# Patient Record
Sex: Male | Born: 1935 | Race: White | Hispanic: No | Marital: Single
Health system: Southern US, Community
[De-identification: ages and names within clinical notes are randomized; demographics above are authoritative.]

## PROBLEM LIST (undated history)

## (undated) DIAGNOSIS — I10 Essential (primary) hypertension: Secondary | ICD-10-CM

## (undated) DIAGNOSIS — E785 Hyperlipidemia, unspecified: Secondary | ICD-10-CM

---

## 2010-07-10 ENCOUNTER — Ambulatory Visit (HOSPITAL_COMMUNITY)
Admission: RE | Admit: 2010-07-10 | Discharge: 2010-07-11 | Payer: Self-pay | Source: Home / Self Care | Admitting: Ophthalmology

## 2011-02-22 LAB — CBC
Hemoglobin: 12.1 g/dL — ABNORMAL LOW (ref 13.0–17.0)
MCHC: 32.4 g/dL (ref 30.0–36.0)
MCV: 94.9 fL (ref 78.0–100.0)
Platelets: 198 10*3/uL (ref 150–400)
WBC: 5.1 10*3/uL (ref 4.0–10.5)

## 2011-02-22 LAB — BASIC METABOLIC PANEL
CO2: 23 mEq/L (ref 19–32)
Calcium: 9.2 mg/dL (ref 8.4–10.5)
Creatinine, Ser: 1.59 mg/dL — ABNORMAL HIGH (ref 0.4–1.5)
GFR calc non Af Amer: 43 mL/min — ABNORMAL LOW (ref >60)
Glucose, Bld: 102 mg/dL — ABNORMAL HIGH (ref 70–99)

## 2011-02-22 LAB — SURGICAL PCR SCREEN: MRSA, PCR: NEGATIVE

## 2017-11-10 ENCOUNTER — Other Ambulatory Visit: Payer: Self-pay | Admitting: Physician Assistant

## 2017-11-10 ENCOUNTER — Ambulatory Visit
Admission: RE | Admit: 2017-11-10 | Discharge: 2017-11-10 | Disposition: A | Discharge status: Home / Self Care | Payer: Medicare Other | Source: Ambulatory Visit | Attending: Physician Assistant | Admitting: Physician Assistant

## 2017-11-10 DIAGNOSIS — M79675 Pain in left toe(s): Secondary | ICD-10-CM

## 2019-12-12 ENCOUNTER — Emergency Department (HOSPITAL_COMMUNITY): Payer: Medicare Other

## 2019-12-12 ENCOUNTER — Other Ambulatory Visit: Payer: Self-pay

## 2019-12-12 ENCOUNTER — Encounter (HOSPITAL_COMMUNITY): Payer: Self-pay | Admitting: Primary Care

## 2019-12-12 ENCOUNTER — Inpatient Hospital Stay (HOSPITAL_COMMUNITY)
Admission: EM | Admit: 2019-12-12 | Discharge: 2019-12-17 | DRG: 065 | Disposition: A | Discharge status: Hospice | Payer: Medicare Other | Attending: Internal Medicine | Admitting: Internal Medicine

## 2019-12-12 DIAGNOSIS — D631 Anemia in chronic kidney disease: Secondary | ICD-10-CM | POA: Diagnosis present

## 2019-12-12 DIAGNOSIS — Z66 Do not resuscitate: Secondary | ICD-10-CM | POA: Diagnosis present

## 2019-12-12 DIAGNOSIS — E785 Hyperlipidemia, unspecified: Secondary | ICD-10-CM | POA: Diagnosis present

## 2019-12-12 DIAGNOSIS — G9349 Other encephalopathy: Secondary | ICD-10-CM | POA: Diagnosis present

## 2019-12-12 DIAGNOSIS — N1831 Chronic kidney disease, stage 3a: Secondary | ICD-10-CM | POA: Diagnosis present

## 2019-12-12 DIAGNOSIS — R414 Neurologic neglect syndrome: Secondary | ICD-10-CM | POA: Diagnosis present

## 2019-12-12 DIAGNOSIS — Z79899 Other long term (current) drug therapy: Secondary | ICD-10-CM | POA: Diagnosis not present

## 2019-12-12 DIAGNOSIS — G8191 Hemiplegia, unspecified affecting right dominant side: Secondary | ICD-10-CM | POA: Diagnosis present

## 2019-12-12 DIAGNOSIS — Z7189 Other specified counseling: Secondary | ICD-10-CM

## 2019-12-12 DIAGNOSIS — I10 Essential (primary) hypertension: Secondary | ICD-10-CM | POA: Diagnosis not present

## 2019-12-12 DIAGNOSIS — R319 Hematuria, unspecified: Secondary | ICD-10-CM | POA: Diagnosis present

## 2019-12-12 DIAGNOSIS — H53462 Homonymous bilateral field defects, left side: Secondary | ICD-10-CM | POA: Diagnosis present

## 2019-12-12 DIAGNOSIS — Z515 Encounter for palliative care: Secondary | ICD-10-CM

## 2019-12-12 DIAGNOSIS — R627 Adult failure to thrive: Secondary | ICD-10-CM | POA: Diagnosis present

## 2019-12-12 DIAGNOSIS — I4510 Unspecified right bundle-branch block: Secondary | ICD-10-CM | POA: Diagnosis present

## 2019-12-12 DIAGNOSIS — I63412 Cerebral infarction due to embolism of left middle cerebral artery: Secondary | ICD-10-CM | POA: Diagnosis present

## 2019-12-12 DIAGNOSIS — R29810 Facial weakness: Secondary | ICD-10-CM | POA: Diagnosis present

## 2019-12-12 DIAGNOSIS — Z8673 Personal history of transient ischemic attack (TIA), and cerebral infarction without residual deficits: Secondary | ICD-10-CM | POA: Diagnosis not present

## 2019-12-12 DIAGNOSIS — W19XXXA Unspecified fall, initial encounter: Secondary | ICD-10-CM

## 2019-12-12 DIAGNOSIS — R1310 Dysphagia, unspecified: Secondary | ICD-10-CM | POA: Diagnosis present

## 2019-12-12 DIAGNOSIS — I6389 Other cerebral infarction: Secondary | ICD-10-CM | POA: Diagnosis not present

## 2019-12-12 DIAGNOSIS — I739 Peripheral vascular disease, unspecified: Secondary | ICD-10-CM | POA: Diagnosis present

## 2019-12-12 DIAGNOSIS — K429 Umbilical hernia without obstruction or gangrene: Secondary | ICD-10-CM | POA: Diagnosis present

## 2019-12-12 DIAGNOSIS — R29724 NIHSS score 24: Secondary | ICD-10-CM | POA: Diagnosis present

## 2019-12-12 DIAGNOSIS — R339 Retention of urine, unspecified: Secondary | ICD-10-CM | POA: Diagnosis present

## 2019-12-12 DIAGNOSIS — I63512 Cerebral infarction due to unspecified occlusion or stenosis of left middle cerebral artery: Secondary | ICD-10-CM | POA: Diagnosis not present

## 2019-12-12 DIAGNOSIS — Z20822 Contact with and (suspected) exposure to covid-19: Secondary | ICD-10-CM | POA: Diagnosis present

## 2019-12-12 DIAGNOSIS — I639 Cerebral infarction, unspecified: Secondary | ICD-10-CM | POA: Diagnosis not present

## 2019-12-12 DIAGNOSIS — I129 Hypertensive chronic kidney disease with stage 1 through stage 4 chronic kidney disease, or unspecified chronic kidney disease: Secondary | ICD-10-CM | POA: Diagnosis present

## 2019-12-12 DIAGNOSIS — A630 Anogenital (venereal) warts: Secondary | ICD-10-CM | POA: Diagnosis present

## 2019-12-12 DIAGNOSIS — H5461 Unqualified visual loss, right eye, normal vision left eye: Secondary | ICD-10-CM | POA: Diagnosis present

## 2019-12-12 DIAGNOSIS — R4701 Aphasia: Secondary | ICD-10-CM | POA: Diagnosis present

## 2019-12-12 HISTORY — DX: Hyperlipidemia, unspecified: E78.5

## 2019-12-12 HISTORY — DX: Essential (primary) hypertension: I10

## 2019-12-12 LAB — APTT: aPTT: 24 seconds (ref 24–36)

## 2019-12-12 LAB — CBC
HCT: 36.8 % — ABNORMAL LOW (ref 39.0–52.0)
Hemoglobin: 11.7 g/dL — ABNORMAL LOW (ref 13.0–17.0)
MCH: 28.9 pg (ref 26.0–34.0)
MCHC: 31.8 g/dL (ref 30.0–36.0)
MCV: 90.9 fL (ref 80.0–100.0)
Platelets: 193 10*3/uL (ref 150–400)
RBC: 4.05 MIL/uL — ABNORMAL LOW (ref 4.22–5.81)
RDW: 13.3 % (ref 11.5–15.5)
WBC: 11.6 10*3/uL — ABNORMAL HIGH (ref 4.0–10.5)
nRBC: 0 % (ref 0.0–0.2)

## 2019-12-12 LAB — COMPREHENSIVE METABOLIC PANEL
ALT: 13 U/L (ref 0–44)
AST: 21 U/L (ref 15–41)
Albumin: 3.3 g/dL — ABNORMAL LOW (ref 3.5–5.0)
Alkaline Phosphatase: 151 U/L — ABNORMAL HIGH (ref 38–126)
Anion gap: 11 (ref 5–15)
BUN: 22 mg/dL (ref 8–23)
CO2: 21 mmol/L — ABNORMAL LOW (ref 22–32)
Calcium: 9.2 mg/dL (ref 8.9–10.3)
Chloride: 105 mmol/L (ref 98–111)
Creatinine, Ser: 1.56 mg/dL — ABNORMAL HIGH (ref 0.61–1.24)
GFR calc Af Amer: 48 mL/min — ABNORMAL LOW (ref >60)
GFR calc non Af Amer: 41 mL/min — ABNORMAL LOW (ref >60)
Glucose, Bld: 189 mg/dL — ABNORMAL HIGH (ref 70–99)
Potassium: 4.3 mmol/L (ref 3.5–5.1)
Sodium: 137 mmol/L (ref 135–145)
Total Bilirubin: 0.9 mg/dL (ref 0.3–1.2)
Total Protein: 6.6 g/dL (ref 6.5–8.1)

## 2019-12-12 LAB — I-STAT CHEM 8, ED
BUN: 22 mg/dL (ref 8–23)
Calcium, Ion: 1.18 mmol/L (ref 1.15–1.40)
Chloride: 106 mmol/L (ref 98–111)
Creatinine, Ser: 1.5 mg/dL — ABNORMAL HIGH (ref 0.61–1.24)
Glucose, Bld: 181 mg/dL — ABNORMAL HIGH (ref 70–99)
HCT: 36 % — ABNORMAL LOW (ref 39.0–52.0)
Hemoglobin: 12.2 g/dL — ABNORMAL LOW (ref 13.0–17.0)
Potassium: 4.2 mmol/L (ref 3.5–5.1)
Sodium: 138 mmol/L (ref 135–145)
TCO2: 23 mmol/L (ref 22–32)

## 2019-12-12 LAB — RESPIRATORY PANEL BY RT PCR (FLU A&B, COVID)
Influenza A by PCR: NEGATIVE
Influenza B by PCR: NEGATIVE
SARS Coronavirus 2 by RT PCR: NEGATIVE

## 2019-12-12 LAB — DIFFERENTIAL
Abs Immature Granulocytes: 0.04 10*3/uL (ref 0.00–0.07)
Basophils Absolute: 0 10*3/uL (ref 0.0–0.1)
Basophils Relative: 0 %
Eosinophils Absolute: 0 10*3/uL (ref 0.0–0.5)
Eosinophils Relative: 0 %
Immature Granulocytes: 0 %
Lymphocytes Relative: 6 %
Lymphs Abs: 0.6 10*3/uL — ABNORMAL LOW (ref 0.7–4.0)
Monocytes Absolute: 0.6 10*3/uL (ref 0.1–1.0)
Monocytes Relative: 5 %
Neutro Abs: 10.3 10*3/uL — ABNORMAL HIGH (ref 1.7–7.7)
Neutrophils Relative %: 89 %

## 2019-12-12 LAB — CBG MONITORING, ED: Glucose-Capillary: 165 mg/dL — ABNORMAL HIGH (ref 70–99)

## 2019-12-12 LAB — CK: Total CK: 46 U/L — ABNORMAL LOW (ref 49–397)

## 2019-12-12 LAB — PROTIME-INR
INR: 1.3 — ABNORMAL HIGH (ref 0.8–1.2)
Prothrombin Time: 16.1 seconds — ABNORMAL HIGH (ref 11.4–15.2)

## 2019-12-12 MED ORDER — STROKE: EARLY STAGES OF RECOVERY BOOK: Freq: Once | Status: DC

## 2019-12-12 MED ORDER — LATANOPROST 0.005 % OP SOLN
1.0000 [drp] | Freq: Every day | OPHTHALMIC | Status: DC
  Administered 2019-12-12 – 2019-12-16 (×5): 1 [drp] via OPHTHALMIC
  Filled 2019-12-12: qty 2.5

## 2019-12-12 MED ORDER — SODIUM CHLORIDE 0.9 % IV SOLN: INTRAVENOUS | Status: DC

## 2019-12-12 MED ORDER — ASPIRIN 300 MG RE SUPP
300.0000 mg | Freq: Every day | RECTAL | Status: DC
  Administered 2019-12-12 – 2019-12-16 (×5): 300 mg via RECTAL
  Filled 2019-12-12 (×5): qty 1

## 2019-12-12 MED ORDER — ACETAMINOPHEN 160 MG/5ML PO SOLN: 650.0000 mg | ORAL | Status: DC | PRN

## 2019-12-12 MED ORDER — ENOXAPARIN SODIUM 40 MG/0.4ML ~~LOC~~ SOLN: 40.0000 mg | SUBCUTANEOUS | Status: DC

## 2019-12-12 MED ORDER — SODIUM CHLORIDE 0.9% FLUSH: 3.0000 mL | Freq: Once | INTRAVENOUS | Status: DC

## 2019-12-12 MED ORDER — ASPIRIN 325 MG PO TABS: 325.0000 mg | ORAL_TABLET | Freq: Every day | ORAL | Status: DC

## 2019-12-12 MED ORDER — ACETAMINOPHEN 325 MG PO TABS: 650.0000 mg | ORAL_TABLET | ORAL | Status: DC | PRN

## 2019-12-12 MED ORDER — ACETAMINOPHEN 650 MG RE SUPP
650.0000 mg | RECTAL | Status: DC | PRN
  Administered 2019-12-13 – 2019-12-15 (×2): 650 mg via RECTAL
  Filled 2019-12-12 (×2): qty 1

## 2019-12-12 MED ORDER — IOHEXOL 350 MG/ML SOLN
100.0000 mL | Freq: Once | INTRAVENOUS | Status: AC | PRN
  Administered 2019-12-12: 100 mL via INTRAVENOUS

## 2019-12-12 NOTE — Consult Note (Addendum)
NEURO HOSPITALIST  CONSULT   Requesting Physician: Dr. Lockie Mola    Chief Complaint: Code stroke-left gaze and right side paralysis  History obtained from: Friend/ EMS HPI:                                                                                                                                         Bernard Schmidt is an 84 y.o. male with PMH HTN, HLD who presented to [facility] ED as a code stroke for right side paralysis and left gaze.  Patient unable to provide any history.  Spoke to [employer] ( friend, POA ). He rents a room from [place]. Per her report: He has HTN and HLD. He is able to perform most ADL's independently, but does not cook. Does not drive, mainly due to visual issues. Not on any blood thinners. LKW was 1400 12/11/19. Friend came to pick him up for church and found him lying in the floor.   ED course:  167/72 BG: 165 CTH: no hemorrhage CTA: proximal M1 occlussion CTP:  Core: 45ml, penumbra 168 cc Stat MRI being done  Date last known well: 12/11/19 Time last known well: 1400 tPA Given: no, outside of window Modified Rankin: Rankin Score=1 NIHSS:22  Past Medical History:  Diagnosis Date  . HLD (hyperlipidemia)   . HTN (hypertension)    Family history: Could not be obtained due to patient's aphasia No family history on file.       Social History:  has no history on file for tobacco, alcohol, and drug. Cannot be obtained due to patient's aphasia Allergies: No Known Allergies  Medications:                                                                                                                           Current Facility-Administered Medications  Medication Dose Route Frequency Provider Last Rate Last Admin  . sodium chloride flush (NS) 0.9 % injection 3 mL  3 mL Intravenous Once Virgina Norfolk, DO       Current Outpatient Medications  Medication Sig Dispense Refill  . atorvastatin (LIPITOR) 20 MG  tablet  Take 20 mg by mouth daily.    . benazepril (LOTENSIN) 20 MG tablet Take 20 mg by mouth daily.    Marland Kitchen latanoprost (XALATAN) 0.005 % ophthalmic solution Place 1 drop into the left eye at bedtime.       ROS:                                                                                                                                        unobtainable from patient due to mental status   General Examination:                                                                                                      Blood pressure (!) 121/57, pulse (!) 114, temperature 97.9 F (36.6 C), temperature source Oral, resp. rate (!) 24, SpO2 90 %.  Physical Exam  Constitutional: Appears well-developed and well-nourished.  Psych: Affect appropriate to situation Eyes: Normal external eye and conjunctiva. HENT: Normocephalic, no lesions, without obvious abnormality.   Musculoskeletal-no joint tenderness, deformity or swelling Cardiovascular: Normal rate and regular rhythm.  Respiratory: Effort normal, non-labored breathing saturations WNL GI: Soft.  No distension. There is no tenderness.  Skin: WDI  Neurological Examination Mental Status: Alert, not oriented. Aphasia noted. Dysarthria noted. Not following commands. Left gaze preference. Cranial Nerves: hemianopsia, ptosis not present, left gaze preference. Right  Eye opaque. Left pupil reactive.  smile asymmetric, facial droop right side.   hearing normal bilaterally  Motor/sensory: Right upper extremity responds to noxious stimuli with extension.  Right lower extremities attempts to withdraw. LUE and LLE withdrawal noted.  Tone and bulk:normal tone throughout; no atrophy noted Cerebellar: UTA Gait: deferred   Lab Results: Basic Metabolic Panel: Recent Labs  Lab 12/12/19 1145 12/12/19 1154  NA 137 138  K 4.3 4.2  CL 105 106  CO2 21*  --   GLUCOSE 189* 181*  BUN 22 22  CREATININE 1.56* 1.50*  CALCIUM 9.2  --     CBC: Recent Labs   Lab 12/12/19 1145 12/12/19 1154  WBC 11.6*  --   NEUTROABS 10.3*  --   HGB 11.7* 12.2*  HCT 36.8* 36.0*  MCV 90.9  --   PLT 193  --     CBG: Recent Labs  Lab 12/12/19 1140  GLUCAP 165*    Imaging: CT Code Stroke CTA Head W/WO contrast  Result Date: 12/12/2019 CLINICAL DATA:  Code stroke.  Right-sided weakness. EXAM: CT ANGIOGRAPHY HEAD AND NECK  CT PERFUSION BRAIN TECHNIQUE: Multidetector CT imaging of the head and neck was performed using the standard protocol during bolus administration of intravenous contrast. Multiplanar CT image reconstructions and MIPs were obtained to evaluate the vascular anatomy. Carotid stenosis measurements (when applicable) are obtained utilizing NASCET criteria, using the distal internal carotid diameter as the denominator. Multiphase CT imaging of the brain was performed following IV bolus contrast injection. Subsequent parametric perfusion maps were calculated using RAPID software. CONTRAST:  Administered intravenous contrast not known at this time. COMPARISON:  Noncontrast head CT performed concurrently. FINDINGS: CTA NECK FINDINGS Aortic arch: Standard aortic branching. Soft and calcified plaque within the visualized aortic arch and proximal major branch vessels of the neck. Right carotid system: CCA and ICA patent within the neck without significant stenosis. Minimal calcified plaque at the carotid bifurcation. Partially retropharyngeal course of the ICA. Left carotid system: CCA and ICA patent within the neck without significant stenosis. Minimal soft plaque within the CCA. Minimal mixed plaque at the carotid bifurcation. Vertebral arteries: Codominant. Calcified plaque at the bilateral vertebral artery origins without ostial stenosis. The right vertebral artery is patent throughout the neck with no significant atherosclerotic narrowing. There is moderate narrowing of the right vertebral artery at the C3-C4 level due to prominent uncinate and facet hypertrophy  (series 7, image 202). The left vertebral artery is patent throughout the neck without significant stenosis. Skeleton: No acute bony abnormality. Cervical spondylosis with multilevel posterior disc osteophytes, uncovertebral and facet hypertrophy. There are also prominent multilevel ventral osteophytes. Other neck: No neck mass or cervical lymphadenopathy. Upper chest: No consolidation within the imaged lung apices. Review of the MIP images confirms the above findings CTA HEAD FINDINGS Anterior circulation: The bilateral intracranial internal carotid arteries are patent with scattered calcified plaque but no significant stenosis. The right middle and anterior cerebral arteries are patent without proximal branch occlusion. 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 The left anterior cerebral artery is patent without proximal branch occlusion. Posterior circulation: The intracranial vertebral arteries are patent bilaterally. Mild atherosclerotic narrowing of the intracranial left vertebral artery. The basilar artery is patent without significant stenosis. Bilateral posterior cerebral arteries are patent without proximal branch occlusion. High-grade stenosis within the P3 segment on the right Venous sinuses: Within limitations of contrast timing, no convincing thrombus. Anatomic variants: Posterior communicating arteries are poorly delineated and may be hypoplastic or absent bilaterally. Review of the MIP images confirms the above findings CT Brain Perfusion Findings: ASPECTS: 10 CBF (<30%) Volume: 73mL Perfusion (Tmax>6.0s) volume: Mismatch Volume: 80 mL Infarction Location:Left MCA vascular territory. These results were called by telephone at the time of interpretation on 12/12/2019 at 12:15 pm to provider Dr. Wilford Corner, who verbally acknowledged these results. IMPRESSION: CTA neck: 1. The bilateral common and internal carotid arteries  are patent without significant stenosis. Mild atherosclerotic plaque within the bilateral carotid systems, as described. 2. Moderate narrowing of the right vertebral artery at the C3-C4 level due to uncinate/facet hypertrophy. No significant atherosclerotic narrowing of the vertebral arteries within the neck. CTA head: 1. 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 2. No other proximal large vessel occlusion identified. 3. High-grade focal stenosis within the P3 right PCA of uncertain chronicity. CT perfusion head: 1. Significant motion degradation may limit reliability of the perfusion data. 2. The perfusion software identifies an 88 mL core infarct in the left MCA territory. The perfusion software identifies  a 168 mL region of critically hypoperfused parenchyma in the left MCA territory. Reported mismatch volume 80 mL. Reported mismatch ratio 1.9. This suggest a much larger core infarct than is identifiable on the noncontrast head CT. Electronically Signed   By: Jackey Loge DO   On: 12/12/2019 12:28   CT Code Stroke CTA Neck W/WO contrast  Result Date: 12/12/2019 CLINICAL DATA:  Code stroke.  Right-sided weakness. EXAM: CT ANGIOGRAPHY HEAD AND NECK CT PERFUSION BRAIN TECHNIQUE: Multidetector CT imaging of the head and neck was performed using the standard protocol during bolus administration of intravenous contrast. Multiplanar CT image reconstructions and MIPs were obtained to evaluate the vascular anatomy. Carotid stenosis measurements (when applicable) are obtained utilizing NASCET criteria, using the distal internal carotid diameter as the denominator. Multiphase CT imaging of the brain was performed following IV bolus contrast injection. Subsequent parametric perfusion maps were calculated using RAPID software. CONTRAST:  Administered intravenous contrast not known at this time. COMPARISON:  Noncontrast head CT performed concurrently.  FINDINGS: CTA NECK FINDINGS Aortic arch: Standard aortic branching. Soft and calcified plaque within the visualized aortic arch and proximal major branch vessels of the neck. Right carotid system: CCA and ICA patent within the neck without significant stenosis. Minimal calcified plaque at the carotid bifurcation. Partially retropharyngeal course of the ICA. Left carotid system: CCA and ICA patent within the neck without significant stenosis. Minimal soft plaque within the CCA. Minimal mixed plaque at the carotid bifurcation. Vertebral arteries: Codominant. Calcified plaque at the bilateral vertebral artery origins without ostial stenosis. The right vertebral artery is patent throughout the neck with no significant atherosclerotic narrowing. There is moderate narrowing of the right vertebral artery at the C3-C4 level due to prominent uncinate and facet hypertrophy (series 7, image 202). The left vertebral artery is patent throughout the neck without significant stenosis. Skeleton: No acute bony abnormality. Cervical spondylosis with multilevel posterior disc osteophytes, uncovertebral and facet hypertrophy. There are also prominent multilevel ventral osteophytes. Other neck: No neck mass or cervical lymphadenopathy. Upper chest: No consolidation within the imaged lung apices. Review of the MIP images confirms the above findings CTA HEAD FINDINGS Anterior circulation: The bilateral intracranial internal carotid arteries are patent with scattered calcified plaque but no significant stenosis. The right middle and anterior cerebral arteries are patent without proximal branch occlusion. 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 The left anterior cerebral artery is patent without proximal branch occlusion. Posterior circulation: The intracranial vertebral arteries are patent bilaterally. Mild atherosclerotic narrowing of the intracranial left  vertebral artery. The basilar artery is patent without significant stenosis. Bilateral posterior cerebral arteries are patent without proximal branch occlusion. High-grade stenosis within the P3 segment on the right Venous sinuses: Within limitations of contrast timing, no convincing thrombus. Anatomic variants: Posterior communicating arteries are poorly delineated and may be hypoplastic or absent bilaterally. Review of the MIP images confirms the above findings CT Brain Perfusion Findings: ASPECTS: 10 CBF (<30%) Volume: 88mL Perfusion (Tmax>6.0s) volume: Mismatch Volume: 80 mL Infarction Location:Left MCA vascular territory. These results were called by telephone at the time of interpretation on 12/12/2019 at 12:15 pm to provider Dr. Wilford Corner, who verbally acknowledged these results. IMPRESSION: CTA neck: 1. The bilateral common and internal carotid arteries are patent without significant stenosis. Mild atherosclerotic plaque within the bilateral carotid systems, as described. 2. Moderate narrowing of the right vertebral artery at the C3-C4 level due to uncinate/facet hypertrophy. No significant atherosclerotic  narrowing of the vertebral arteries within the neck. CTA head: 1. 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 2. No other proximal large vessel occlusion identified. 3. High-grade focal stenosis within the P3 right PCA of uncertain chronicity. CT perfusion head: 1. Significant motion degradation may limit reliability of the perfusion data. 2. The perfusion software identifies an 88 mL core infarct in the left MCA territory. The perfusion software identifies a 168 mL region of critically hypoperfused parenchyma in the left MCA territory. Reported mismatch volume 80 mL. Reported mismatch ratio 1.9. This suggest a much larger core infarct than is identifiable on the noncontrast head CT. Electronically Signed   By: Jackey LogeKyle  Golden DO   On: 12/12/2019  12:28   MR BRAIN WO CONTRAST  Result Date: 12/12/2019 CLINICAL DATA:  Neuro deficit, acute, stroke suspected. EXAM: MRI HEAD WITHOUT CONTRAST TECHNIQUE: Multiplanar, multiecho pulse sequences of the brain and surrounding structures were obtained without intravenous contrast. COMPARISON:  Noncontrast head CT and CT angiogram head/neck as well as CT perfusion performed earlier the same day. FINDINGS: Brain: A limited protocol utilizing only axial and coronal diffusion-weighted imaging, as well as axial T2 FLAIR imaging is performed. The axial T2 FLAIR sequence is significantly motion degraded, limiting evaluation. There is a moderate to large region of cortical/subcortical restricted diffusion within the left MCA vascular territory involving the left temporal lobe, left insula and left frontal operculum. To a lesser degree there is acute infarction change within the left parietal cortex. There is patchy, mild corresponding T2/FLAIR hyperintensity. There are multiple additional punctate cortically based infarcts within the high left frontal lobe, right frontal lobe and bilateral parietooccipital lobes. A small acute infarct is also questioned within the right basal ganglia. There are also several small acute infarcts within the cerebellar hemispheres. Chronic right temporal occipital lobe cortically based infarct. Background of moderate chronic small vessel ischemic disease with chronic lacunar infarcts within bilateral basal ganglia and thalami. Vascular: Poorly assessed on the acquired sequences. Skull and upper cervical spine: Limited assessment for calvarial lesions on the acquired sequences. No marrow signal abnormality identified. Sinuses/Orbits: Limited assessment of the orbits on the acquired sequences. Partial opacification of the left maxillary sinus. These results were called by telephone at the time of interpretation on 12/12/2019 at 12:59 pm to provider [facility] Asc[facility] Jet Armbrust , who verbally acknowledged these  results. IMPRESSION: Limited protocol, motion degraded examination. Moderate to large acute infarct within the left MCA vascular territory, as detailed. Multiple small additional acute infarcts within the bilateral frontal and parietooccipital lobes, right basal ganglia and cerebellum. Correlate for an embolic phenomenon. Chronic right temporal occipital cortically based infarct. Background of moderate chronic small vessel ischemic disease. Chronic lacunar infarcts within the bilateral basal ganglia and thalami. Electronically Signed   By: Jackey LogeKyle  Golden DO   On: 12/12/2019 12:58   CT Code Stroke Cerebral Perfusion with contrast  Result Date: 12/12/2019 CLINICAL DATA:  Code stroke.  Right-sided weakness. EXAM: CT ANGIOGRAPHY HEAD AND NECK CT PERFUSION BRAIN TECHNIQUE: Multidetector CT imaging of the head and neck was performed using the standard protocol during bolus administration of intravenous contrast. Multiplanar CT image reconstructions and MIPs were obtained to evaluate the vascular anatomy. Carotid stenosis measurements (when applicable) are obtained utilizing NASCET criteria, using the distal internal carotid diameter as the denominator. Multiphase CT imaging of the brain was performed following IV bolus contrast injection. Subsequent parametric perfusion maps were calculated using RAPID software. CONTRAST:  Administered intravenous contrast not  known at this time. COMPARISON:  Noncontrast head CT performed concurrently. FINDINGS: CTA NECK FINDINGS Aortic arch: Standard aortic branching. Soft and calcified plaque within the visualized aortic arch and proximal major branch vessels of the neck. Right carotid system: CCA and ICA patent within the neck without significant stenosis. Minimal calcified plaque at the carotid bifurcation. Partially retropharyngeal course of the ICA. Left carotid system: CCA and ICA patent within the neck without significant stenosis. Minimal soft plaque within the CCA. Minimal  mixed plaque at the carotid bifurcation. Vertebral arteries: Codominant. Calcified plaque at the bilateral vertebral artery origins without ostial stenosis. The right vertebral artery is patent throughout the neck with no significant atherosclerotic narrowing. There is moderate narrowing of the right vertebral artery at the C3-C4 level due to prominent uncinate and facet hypertrophy (series 7, image 202). The left vertebral artery is patent throughout the neck without significant stenosis. Skeleton: No acute bony abnormality. Cervical spondylosis with multilevel posterior disc osteophytes, uncovertebral and facet hypertrophy. There are also prominent multilevel ventral osteophytes. Other neck: No neck mass or cervical lymphadenopathy. Upper chest: No consolidation within the imaged lung apices. Review of the MIP images confirms the above findings CTA HEAD FINDINGS Anterior circulation: The bilateral intracranial internal carotid arteries are patent with scattered calcified plaque but no significant stenosis. The right middle and anterior cerebral arteries are patent without proximal branch occlusion. 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 The left anterior cerebral artery is patent without proximal branch occlusion. Posterior circulation: The intracranial vertebral arteries are patent bilaterally. Mild atherosclerotic narrowing of the intracranial left vertebral artery. The basilar artery is patent without significant stenosis. Bilateral posterior cerebral arteries are patent without proximal branch occlusion. High-grade stenosis within the P3 segment on the right Venous sinuses: Within limitations of contrast timing, no convincing thrombus. Anatomic variants: Posterior communicating arteries are poorly delineated and may be hypoplastic or absent bilaterally. Review of the MIP images confirms the above findings CT Brain Perfusion  Findings: ASPECTS: 10 CBF (<30%) Volume: 71mL Perfusion (Tmax>6.0s) volume: 165mL Mismatch Volume: 80 mL Infarction Location:Left MCA vascular territory. These results were called by telephone at the time of interpretation on 12/12/2019 at 12:15 pm to provider Dr. Rory Percy, who verbally acknowledged these results. IMPRESSION: CTA neck: 1. The bilateral common and internal carotid arteries are patent without significant stenosis. Mild atherosclerotic plaque within the bilateral carotid systems, as described. 2. Moderate narrowing of the right vertebral artery at the C3-C4 level due to uncinate/facet hypertrophy. No significant atherosclerotic narrowing of the vertebral arteries within the neck. CTA head: 1. 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 2. No other proximal large vessel occlusion identified. 3. High-grade focal stenosis within the P3 right PCA of uncertain chronicity. CT perfusion head: 1. Significant motion degradation may limit reliability of the perfusion data. 2. The perfusion software identifies an 88 mL core infarct in the left MCA territory. The perfusion software identifies a 168 mL region of critically hypoperfused parenchyma in the left MCA territory. Reported mismatch volume 80 mL. Reported mismatch ratio 1.9. This suggest a much larger core infarct than is identifiable on the noncontrast head CT. Electronically Signed   By: Kellie Simmering DO   On: 12/12/2019 12:28   CT HEAD CODE STROKE WO CONTRAST  Result Date: 12/12/2019 CLINICAL DATA:  Code stroke. Neuro deficit, acute, stroke suspected. Sudden onset right-sided weakness, aphasia, left gaze EXAM: CT HEAD WITHOUT  CONTRAST TECHNIQUE: Contiguous axial images were obtained from the base of the skull through the vertex without intravenous contrast. COMPARISON:  No pertinent prior studies available for comparison. FINDINGS: Brain: No evidence of acute intracranial hemorrhage. No acute  demarcated cortical infarct is identified. Chronic appearing right temporal occipital lobe cortically based infarct. Ill-defined hypoattenuation within the cerebral white matter is nonspecific, but consistent with chronic small vessel ischemic disease. Age-indeterminate lacunar infarcts within the right basal ganglia and left thalamus. Mild generalized parenchymal atrophy. No evidence of intracranial mass. No midline shift. Probable thin right frontoparietal hygroma or chronic subdural hematoma measuring up to 5 mm (series 5, image 28). Vascular: A dense M1 left MCA is questioned (series 3, image 17). Atherosclerotic calcifications. Skull: Normal. Negative for fracture or focal lesion. Sinuses/Orbits: Visualized orbits demonstrate no acute abnormality. Partial opacification of the left maxillary sinus. Mucous retention cyst within the inferior right maxillary sinus. No significant mastoid effusion. These results were called by telephone at the time of interpretation on 12/12/2019 at 12:06 pm to provider Dr. Wilford CornerArora, who verbally acknowledged these results. IMPRESSION: No acute intracranial hemorrhage or acute demarcated cortical infarction identified. Suspicion for hyperdense M1 left MCA. ASPECTS 10 for left MCA vascular territory. Age-indeterminate lacunar infarcts within the right basal ganglia and left thalamus. 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 Chronic appearing cortically based infarct within the right temporal occipital lobes. Background generalized parenchymal atrophy and chronic small vessel ischemic disease. Electronically Signed   By: Jackey LogeKyle  Golden DO   On: 12/12/2019 12:09   Valentina LucksJessica Williams, MSN, NP-C [facility] [phone]  12/12/2019, 1:36 PM   Attending physician note to follow with Assessment and plan .  Attending addendum Patient seen and examined as acute code stroke Sudden onset of aphasia, right hemiparesis and leftward  gaze suggestive of a left MCA syndrome. Outside the window for IV TPA CT head reviewed personally-aspects 9.  Chronic strokes in both hemispheres but according to the family no history of strokes clinically. CT perfusion with some technical difficulty in the core of 88 cc. Because of the unreliable curve on the perfusion study, a stat MRI was done. MRI study reviewed personally, limited study.  Moderate to large acute infarct in the left MCA vascular territory corresponding to the core on the CT perfusion.  CTA CTP and MRI studies discussed with neuroradiology and neuro interventional radiologist on call over phone. 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 Assessment: 84 y.o. male with HTN and HLD. [facility] as a code stroke with c/o left gaze, right side paralysis. [facility] not a [facility] candidate d/t presenting outside of the window. CTP revealed a core of 87  An MRI was obtained that confirmed the core to be congruent to the perfusion deficit and he was determined to not be a candidate for IR given large core. Risk outweighed the benefit.   Distribution of the strokes on the MRI that includes bilateral anterior and posterior circulations raises a strong suspicion for a cardioembolic source.  According to the POA, patient is a DNR.  According to her, he would also not want heroic measures to be done in case he deteriorates and decompensates. POA's name: Kary KosLouise Powers -phone [phone].  She has copies of his H POA notarized as well as DNR.  The stroke will be large and pretty disabling by the distribution that it encompasses.  Primary  team should  Stroke Risk Factors - hyperlipidemia and hypertension Etiology : cardioembolic   Recommendations: Aspirin 325 PT/OT/ Speech High intensity statin if LDL > 70 Stroke Swallow screen  HgbA1c/ lipid panel Telemetry  monitoring Echo frequent neuro monitoring Request social work to obtain POA and DNR paperwork from [facility] listed above.  She is elderly herself and unable to come to the hospital.  -- Milon Dikes, MD [facility] Pager: [phone] If 7pm to 7am, please call on call as listed on AMION.  --please page stroke NP  Or  PA  Or MD from 8am -4 pm  as this patient from this time will be  followed by the stroke.   You can look them up on [email or web address]  Password [email or web address]  CRITICAL CARE ATTESTATION Performed by: Milon Dikes, MD Total critical care time: Critical care time was exclusive of separately billable procedures and treating other patients and/or supervising APPs/Residents/Students Critical care was necessary to treat or prevent imminent or life-threatening deterioration due to code stroke, left MCA stroke This patient is critically ill and at significant risk for neurological worsening and/or death and care requires constant monitoring. Critical care was time spent personally by me on the following activities: development of treatment plan with patient and/or surrogate as well as nursing, discussions with consultants, evaluation of patient's response to treatment, examination of patient, obtaining history from patient or surrogate, ordering and performing treatments and interventions, ordering and review of laboratory studies, ordering and review of radiographic studies, pulse oximetry, re-evaluation of patient's condition, participation in multidisciplinary rounds and medical decision making of high complexity in the care of this patient.

## 2019-12-12 NOTE — ED Provider Notes (Signed)
[facility] EMERGENCY DEPARTMENT Provider Note   CSN: [id number] Arrival date & time: 12/12/19  1138     History Chief Complaint  Patient presents with  . Code Stroke    Bernard Schmidt is a 84 y.o. male.  LEVEL 5 caveat due to AMS. Patient arrives as code stroke. Aphasia, left side preference, right sided weakness. Pt DNR per EMS per POA.   The history is provided by the EMS personnel and a caregiver.  Neurologic Problem This is a new problem. The current episode started 12 to 24 hours ago (last known normal 2 pm yesterday). The problem occurs constantly. The problem has not changed since onset.Associated symptoms comments: Unable to provider due to AMS. Nothing aggravates the symptoms. Nothing relieves the symptoms. He has tried nothing for the symptoms. The treatment provided no relief.       Past Medical History:  Diagnosis Date  . HLD (hyperlipidemia)   . HTN (hypertension)     There are no problems to display for this patient.       No family history on file.  Social History   Tobacco Use  . Smoking status: Not on file  Substance Use Topics  . Alcohol use: Not on file  . Drug use: Not on file    Home Medications Prior to Admission medications   Not on File    Allergies    Patient has no allergy information on record.  Review of Systems   Review of Systems  Unable to perform ROS: Acuity of condition    Physical Exam Updated Vital Signs  ED Triage Vitals  Enc Vitals Group     BP 12/12/19 1215 (!) 167/82     Pulse Rate 12/12/19 1215 (!) 114     Resp 12/12/19 1215 20     Temp 12/12/19 1215 97.9 F (36.6 C)     Temp Source 12/12/19 1215 Oral     SpO2 12/12/19 1214 100 %     Weight --      Height --      Head Circumference --      Peak Flow --      Pain Score 12/12/19 1216 0     Pain Loc --      Pain Edu? --      Excl. in [place]? --     Physical Exam Vitals and nursing note reviewed.  Constitutional:      Appearance:  He is well-developed. He is not ill-appearing.  HENT:     Head: Normocephalic and atraumatic.     Mouth/Throat:     Mouth: Mucous membranes are dry.  Eyes:     Conjunctiva/sclera: Conjunctivae normal.     Comments: Right corneal clouding, left sided gaze preference  Cardiovascular:     Rate and Rhythm: Normal rate and regular rhythm.     Pulses: Normal pulses.     Heart sounds: Normal heart sounds. No murmur.  Pulmonary:     Effort: No respiratory distress.     Breath sounds: Normal breath sounds.  Abdominal:     Palpations: Abdomen is soft.     Tenderness: There is no abdominal tenderness.     Hernia: A hernia (easily reducible umbilical hernia) is present.  Musculoskeletal:     Cervical back: Neck supple.     Right lower leg: No edema.     Left lower leg: No edema.  Skin:    General: Skin is warm and dry.  Neurological:  Mental Status: He is lethargic.     GCS: GCS eye subscore is 2. GCS verbal subscore is 1. GCS motor subscore is 5.     Comments: Withdraws and localizes to pain on left side, minimal movement of right to pain     ED Results / Procedures / Treatments   Labs (all labs ordered are listed, but only abnormal results are displayed) Labs Reviewed  PROTIME-INR - Abnormal; Notable for the following components:      Result Value   Prothrombin Time 16.1 (*)    INR 1.3 (*)    All other components within normal limits  CBC - Abnormal; Notable for the following components:   WBC 11.6 (*)    RBC 4.05 (*)    Hemoglobin 11.7 (*)    HCT 36.8 (*)    All other components within normal limits  DIFFERENTIAL - Abnormal; Notable for the following components:   Neutro Abs 10.3 (*)    Lymphs Abs 0.6 (*)    All other components within normal limits  I-STAT CHEM 8, ED - Abnormal; Notable for the following components:   Creatinine, Ser 1.50 (*)    Glucose, Bld 181 (*)    Hemoglobin 12.2 (*)    HCT 36.0 (*)    All other components within normal limits  CBG MONITORING,  ED - Abnormal; Notable for the following components:   Glucose-Capillary 165 (*)    All other components within normal limits  RESPIRATORY PANEL BY RT PCR (FLU A&B, COVID)  APTT  COMPREHENSIVE METABOLIC PANEL  URINALYSIS, ROUTINE W REFLEX MICROSCOPIC  CK    EKG None  Radiology CT HEAD CODE STROKE WO CONTRAST  Result Date: 12/12/2019 CLINICAL DATA:  Code stroke. Neuro deficit, acute, stroke suspected. Sudden onset right-sided weakness, aphasia, left gaze EXAM: CT HEAD WITHOUT CONTRAST TECHNIQUE: Contiguous axial images were obtained from the base of the skull through the vertex without intravenous contrast. COMPARISON:  No pertinent prior studies available for comparison. FINDINGS: Brain: No evidence of acute intracranial hemorrhage. No acute demarcated cortical infarct is identified. Chronic appearing right temporal occipital lobe cortically based infarct. Ill-defined hypoattenuation within the cerebral white matter is nonspecific, but consistent with chronic small vessel ischemic disease. Age-indeterminate lacunar infarcts within the right basal ganglia and left thalamus. Mild generalized parenchymal atrophy. No evidence of intracranial mass. No midline shift. Probable thin right frontoparietal hygroma or chronic subdural hematoma measuring up to 5 mm (series 5, image 28). Vascular: A dense M1 left MCA is questioned (series 3, image 17). Atherosclerotic calcifications. Skull: Normal. Negative for fracture or focal lesion. Sinuses/Orbits: Visualized orbits demonstrate no acute abnormality. Partial opacification of the left maxillary sinus. Mucous retention cyst within the inferior right maxillary sinus. No significant mastoid effusion. These results were called by telephone at the time of interpretation on 12/12/2019 at 12:06 pm to provider Dr. Wilford Corner, who verbally acknowledged these results. IMPRESSION: No acute intracranial hemorrhage or acute demarcated cortical infarction identified. Suspicion for  hyperdense M1 left MCA. ASPECTS 10 for left MCA vascular territory. Age-indeterminate lacunar infarcts within the right basal ganglia and left thalamus. 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 Chronic appearing cortically based infarct within the right temporal occipital lobes. Background generalized parenchymal atrophy and chronic small vessel ischemic disease. Electronically Signed   By: Jackey Loge DO   On: 12/12/2019 12:09    Procedures .Critical Care Performed by: Virgina Norfolk, DO Authorized by: Virgina Norfolk, DO   Critical  care provider statement:    Critical care time (minutes):  40   Critical care was necessary to treat or prevent imminent or life-threatening deterioration of the following conditions:  CNS failure or compromise   Critical care was time spent personally by me on the following activities:  Blood draw for specimens, development of treatment plan with patient or surrogate, discussions with primary provider, evaluation of patient's response to treatment, examination of patient, obtaining history from patient or surrogate, ordering and performing treatments and interventions, ordering and review of laboratory studies, ordering and review of radiographic studies, pulse oximetry, re-evaluation of patient's condition and review of old charts   I assumed direction of critical care for this patient from another provider in my specialty: no     (including critical care time)  Medications Ordered in ED Medications  sodium chloride flush (NS) 0.9 % injection 3 mL (has no administration in time range)  iohexol (OMNIPAQUE) 350 MG/ML injection 100 mL (100 mLs Intravenous Contrast Given 12/12/19 1211)    ED Course  I have reviewed the triage vital signs and the nursing notes.  Pertinent labs & imaging results that were available during my care of the patient were reviewed by me and considered in my medical decision making (see  chart for details).    MDM Rules/Calculators/A&P  Dirk Vanaman is an 84 year old male with history of high cholesterol who presents to the ED as a code stroke.  Patient with overall unremarkable vitals.  Patient has about a GCS of 8.  He does localize well to pain on his left side but appears to have weakness on the right side.  He is aphasic.  Appears to have left-sided preference.  He is a DO NOT RESUSCITATE.  Therefore, patient went directly to CT scan to evaluate for possible stroke.  Last seen normal was 2 PM yesterday.  He is within the window of treatment for large vessel occlusion.  He is outside the window for TPA.  Power of attorney states that they would reverse DNR for an intervention such as large vessel occlusion treatment.  CT imaging does show acute left M1 occlusion consistent with his symptoms.  Neurology is discussing case with interventional and anticipate likely treatment with interventional radiology.  Patient with lab work collected including urinalysis.  Covid testing is ordered.  Per power of attorney, patient is fully independent of his ADLs.  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  Patient had stat MRI.  Overall, after neurology discussed case with neuroradiology and interventional he does not appear to be a good candidate for IR intervention.  It appears that stroke has progressed to the point where procedure would likely cause more harm than benefit.  Therefore, patient will be admitted to medicine service for further care.  Will likely need palliative care consultation.  He is DNR.  This chart was dictated using voice recognition software.  Despite best efforts to proofread,  errors can occur which can change the documentation meaning.     Final Clinical Impression(s) / ED Diagnoses Final diagnoses:  Cerebrovascular accident (CVA), unspecified mechanism [facility])    Rx / DC Orders ED Discharge Orders    None       Virgina Norfolk,  DO 12/12/19 1300

## 2019-12-12 NOTE — H&P (Addendum)
History and Physical    Bernard BatheCurtis Mapp FOY:[id number] DOB: 12-Sep-1936 DOA: 12/12/2019  PCP: Roger KillWilliams, Breejante J, PA-C Consultants:  None Patient coming from:  Home - lives alone; NOK: Adair PatterFriend, Louise Powers, [phone]  Chief Complaint: Right-sided weakness  HPI: Bernard Schmidt is a 84 y.o. male with medical history significant of HTN and HLD with right-sided weakness.  The patient is unable to provide history.  He lives in a rental home owned by Kary KosLouise Powers and she reports that she takes him to all of his appointments and makes all of his health care decisions and has necessary paperwork.  His last known well was 1/2 PM and he was found this morning lying in the floor.  He had his left eye open and she was able to look into it and tell him that she was sending him to the hospital.    ED Course:  Stroke - ?last night.  Last normal yesterday afternoon.  Aphasic, right-sided neglect, left-sided gaze preference.  Late presentation, STAT MRI, neurointervention risk worse than probable benefit.  Patient is DNR.  Will need to follow to determine whether he will regain any function.  Review of Systems: Unable to perform    Past Medical History:  Diagnosis Date  . HLD (hyperlipidemia)   . HTN (hypertension)     History reviewed. No pertinent surgical history.  Social History   Socioeconomic History  . Marital status: Single    Spouse name: Not on file  . Number of children: Not on file  . Years of education: Not on file  . Highest education level: Not on file  Occupational History  . Not on file  Tobacco Use  . Smoking status: Not on file  Substance and Sexual Activity  . Alcohol use: Not on file  . Drug use: Not on file  . Sexual activity: Not on file  Other Topics Concern  . Not on file  Social History Narrative  . Not on file   Social Determinants of Health   Financial Resource Strain:   . Difficulty of Paying Living Expenses: Not on file  Food Insecurity:   .  Worried About Programme researcher, broadcasting/film/videounning Out of Food in the Last Year: Not on file  . Ran Out of Food in the Last Year: Not on file  Transportation Needs:   . Lack of Transportation (Medical): Not on file  . Lack of Transportation (Non-Medical): Not on file  Physical Activity:   . Days of Exercise per Week: Not on file  . Minutes of Exercise per Session: Not on file  Stress:   . Feeling of Stress : Not on file  Social Connections:   . Frequency of Communication with Friends and Family: Not on file  . Frequency of Social Gatherings with Friends and Family: Not on file  . Attends Religious Services: Not on file  . Active Member of Clubs or Organizations: Not on file  . Attends BankerClub or Organization Meetings: Not on file  . Marital Status: Not on file  Intimate Partner Violence:   . Fear of Current or Ex-Partner: Not on file  . Emotionally Abused: Not on file  . Physically Abused: Not on file  . Sexually Abused: Not on file    No Known Allergies  No family history on file.  Prior to Admission medications   Medication Sig Start Date End Date Taking? Authorizing Provider  atorvastatin (LIPITOR) 20 MG tablet Take 20 mg by mouth daily. 10/30/19   [provider]  benazepril (  LOTENSIN) 20 MG tablet Take 20 mg by mouth daily. 10/30/19   [provider]  latanoprost (XALATAN) 0.005 % ophthalmic solution Place 1 drop into the left eye at bedtime. 09/25/19   [provider]    Physical Exam: Vitals:   12/12/19 1545 12/12/19 1600 12/12/19 1615 12/12/19 1645  BP: 122/70 (!) 148/92 (!) 156/76 (!) 158/81  Pulse: (!) 105 (!) 110  (!) 110  Resp: (!) 27 (!) 25 (!) 25 (!) 26  Temp:      TempSrc:      SpO2: 99% 99%  99%     . 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 Eyes:  Left eye is open and staring but unable to fix gaze, normal lids, iris . ENT:  grossly normal lips & tongue . Neck:  no LAD, masses or  thyromegaly . Cardiovascular:  RR with tachycardia, no m/r/g. No LE edema.  Marland Kitchen Respiratory:   CTA bilaterally with no wheezes/rales/rhonchi.  Normal respiratory effort. . Abdomen:  soft, NT, ND, NABS . Skin:  no rash or induration seen on limited exam . Musculoskeletal:  LUE with spontaneous movement, minimal to no movement on right . Psychiatric:  Aphasic, unable to follow commands . Neurologic:  R facial droop, R eye closed, otherwise as above    Radiological Exams on Admission: CT Code Stroke CTA Head W/WO contrast  Result Date: 12/12/2019 CLINICAL DATA:  Code stroke.  Right-sided weakness. EXAM: CT ANGIOGRAPHY HEAD AND NECK CT PERFUSION BRAIN TECHNIQUE: Multidetector CT imaging of the head and neck was performed using the standard protocol during bolus administration of intravenous contrast. Multiplanar CT image reconstructions and MIPs were obtained to evaluate the vascular anatomy. Carotid stenosis measurements (when applicable) are obtained utilizing NASCET criteria, using the distal internal carotid diameter as the denominator. Multiphase CT imaging of the brain was performed following IV bolus contrast injection. Subsequent parametric perfusion maps were calculated using RAPID software. CONTRAST:  Administered intravenous contrast not known at this time. COMPARISON:  Noncontrast head CT performed concurrently. FINDINGS: CTA NECK FINDINGS Aortic arch: Standard aortic branching. Soft and calcified plaque within the visualized aortic arch and proximal major branch vessels of the neck. Right carotid system: CCA and ICA patent within the neck without significant stenosis. Minimal calcified plaque at the carotid bifurcation. Partially retropharyngeal course of the ICA. Left carotid system: CCA and ICA patent within the neck without significant stenosis. Minimal soft plaque within the CCA. Minimal mixed plaque at the carotid bifurcation. Vertebral arteries: Codominant. Calcified plaque at the bilateral  vertebral artery origins without ostial stenosis. The right vertebral artery is patent throughout the neck with no significant atherosclerotic narrowing. There is moderate narrowing of the right vertebral artery at the C3-C4 level due to prominent uncinate and facet hypertrophy (series 7, image 202). The left vertebral artery is patent throughout the neck without significant stenosis. Skeleton: No acute bony abnormality. Cervical spondylosis with multilevel posterior disc osteophytes, uncovertebral and facet hypertrophy. There are also prominent multilevel ventral osteophytes. Other neck: No neck mass or cervical lymphadenopathy. Upper chest: No consolidation within the imaged lung apices. Review of the MIP images confirms the above findings CTA HEAD FINDINGS Anterior circulation: The bilateral intracranial internal carotid arteries are patent with scattered calcified plaque but no significant stenosis. The right middle and anterior cerebral arteries are patent without proximal branch occlusion. 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  The left anterior cerebral artery is patent without proximal branch occlusion. Posterior circulation: The intracranial vertebral arteries are patent bilaterally. Mild atherosclerotic narrowing of the intracranial left vertebral artery. The basilar artery is patent without significant stenosis. Bilateral posterior cerebral arteries are patent without proximal branch occlusion. High-grade stenosis within the P3 segment on the right Venous sinuses: Within limitations of contrast timing, no convincing thrombus. Anatomic variants: Posterior communicating arteries are poorly delineated and may be hypoplastic or absent bilaterally. Review of the MIP images confirms the above findings CT Brain Perfusion Findings: ASPECTS: 10 CBF (<30%) Volume: 33mL Perfusion (Tmax>6.0s) volume: Mismatch Volume: 80 mL  Infarction Location:Left MCA vascular territory. These results were called by telephone at the time of interpretation on 12/12/2019 at 12:15 pm to provider Dr. Wilford Corner, who verbally acknowledged these results. IMPRESSION: CTA neck: 1. The bilateral common and internal carotid arteries are patent without significant stenosis. Mild atherosclerotic plaque within the bilateral carotid systems, as described. 2. Moderate narrowing of the right vertebral artery at the C3-C4 level due to uncinate/facet hypertrophy. No significant atherosclerotic narrowing of the vertebral arteries within the neck. CTA head: 1. 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 2. No other proximal large vessel occlusion identified. 3. High-grade focal stenosis within the P3 right PCA of uncertain chronicity. CT perfusion head: 1. Significant motion degradation may limit reliability of the perfusion data. 2. The perfusion software identifies an 88 mL core infarct in the left MCA territory. The perfusion software identifies a 168 mL region of critically hypoperfused parenchyma in the left MCA territory. Reported mismatch volume 80 mL. Reported mismatch ratio 1.9. This suggest a much larger core infarct than is identifiable on the noncontrast head CT. Electronically Signed   By: Jackey Loge DO   On: 12/12/2019 12:28   CT Code Stroke CTA Neck W/WO contrast  Result Date: 12/12/2019 CLINICAL DATA:  Code stroke.  Right-sided weakness. EXAM: CT ANGIOGRAPHY HEAD AND NECK CT PERFUSION BRAIN TECHNIQUE: Multidetector CT imaging of the head and neck was performed using the standard protocol during bolus administration of intravenous contrast. Multiplanar CT image reconstructions and MIPs were obtained to evaluate the vascular anatomy. Carotid stenosis measurements (when applicable) are obtained utilizing NASCET criteria, using the distal internal carotid diameter as the denominator. Multiphase CT  imaging of the brain was performed following IV bolus contrast injection. Subsequent parametric perfusion maps were calculated using RAPID software. CONTRAST:  Administered intravenous contrast not known at this time. COMPARISON:  Noncontrast head CT performed concurrently. FINDINGS: CTA NECK FINDINGS Aortic arch: Standard aortic branching. Soft and calcified plaque within the visualized aortic arch and proximal major branch vessels of the neck. Right carotid system: CCA and ICA patent within the neck without significant stenosis. Minimal calcified plaque at the carotid bifurcation. Partially retropharyngeal course of the ICA. Left carotid system: CCA and ICA patent within the neck without significant stenosis. Minimal soft plaque within the CCA. Minimal mixed plaque at the carotid bifurcation. Vertebral arteries: Codominant. Calcified plaque at the bilateral vertebral artery origins without ostial stenosis. The right vertebral artery is patent throughout the neck with no significant atherosclerotic narrowing. There is moderate narrowing of the right vertebral artery at the C3-C4 level due to prominent uncinate and facet hypertrophy (series 7, image 202). The left vertebral artery is patent throughout the neck without significant stenosis. Skeleton: No acute bony abnormality. Cervical spondylosis with multilevel posterior disc osteophytes, uncovertebral and facet hypertrophy. There are also prominent multilevel ventral  osteophytes. Other neck: No neck mass or cervical lymphadenopathy. Upper chest: No consolidation within the imaged lung apices. Review of the MIP images confirms the above findings CTA HEAD FINDINGS Anterior circulation: The bilateral intracranial internal carotid arteries are patent with scattered calcified plaque but no significant stenosis. The right middle and anterior cerebral arteries are patent without proximal branch occlusion. There is abrupt occlusion of the proximal M1 left middle cerebral  artery without significant reconstitution more distally. The left anterior cerebral artery is patent without proximal branch occlusion. Posterior circulation: The intracranial vertebral arteries are patent bilaterally. Mild atherosclerotic narrowing of the intracranial left vertebral artery. The basilar artery is patent without significant stenosis. Bilateral posterior cerebral arteries are patent without proximal branch occlusion. High-grade stenosis within the P3 segment on the right Venous sinuses: Within limitations of contrast timing, no convincing thrombus. Anatomic variants: Posterior communicating arteries are poorly delineated and may be hypoplastic or absent bilaterally. Review of the MIP images confirms the above findings CT Brain Perfusion Findings: ASPECTS: 10 CBF (<30%) Volume: 88mL Perfusion (Tmax>6.0s) volume: Mismatch Volume: 80 mL Infarction Location:Left MCA vascular territory. These results were called by telephone at the time of interpretation on 12/12/2019 at 12:15 pm to provider Dr. Wilford Corner, who verbally acknowledged these results. IMPRESSION: CTA neck: 1. The bilateral common and internal carotid arteries are patent without significant stenosis. Mild atherosclerotic plaque within the bilateral carotid systems, as described. 2. Moderate narrowing of the right vertebral artery at the C3-C4 level due to uncinate/facet hypertrophy. No significant atherosclerotic narrowing of the vertebral arteries within the neck. CTA head: 1. 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 2. No other proximal large vessel occlusion identified. 3. High-grade focal stenosis within the P3 right PCA of uncertain chronicity. CT perfusion head: 1. Significant motion degradation may limit reliability of the perfusion data. 2. The perfusion software identifies an 88 mL core infarct in the left MCA territory. The perfusion software identifies a 168 mL region  of critically hypoperfused parenchyma in the left MCA territory. Reported mismatch volume 80 mL. Reported mismatch ratio 1.9. This suggest a much larger core infarct than is identifiable on the noncontrast head CT. Electronically Signed   By: Jackey Loge DO   On: 12/12/2019 12:28   MR BRAIN WO CONTRAST  Result Date: 12/12/2019 CLINICAL DATA:  Neuro deficit, acute, stroke suspected. EXAM: MRI HEAD WITHOUT CONTRAST TECHNIQUE: Multiplanar, multiecho pulse sequences of the brain and surrounding structures were obtained without intravenous contrast. COMPARISON:  Noncontrast head CT and CT angiogram head/neck as well as CT perfusion performed earlier the same day. FINDINGS: Brain: A limited protocol utilizing only axial and coronal diffusion-weighted imaging, as well as axial T2 FLAIR imaging is performed. The axial T2 FLAIR sequence is significantly motion degraded, limiting evaluation. There is a moderate to large region of cortical/subcortical restricted diffusion within the left MCA vascular territory involving the left temporal lobe, left insula and left frontal operculum. To a lesser degree there is acute infarction change within the left parietal cortex. There is patchy, mild corresponding T2/FLAIR hyperintensity. There are multiple additional punctate cortically based infarcts within the high left frontal lobe, right frontal lobe and bilateral parietooccipital lobes. A small acute infarct is also questioned within the right basal ganglia. There are also several small acute infarcts within the cerebellar hemispheres. Chronic right temporal occipital lobe cortically based infarct. Background of moderate chronic small vessel ischemic disease with chronic lacunar infarcts within bilateral basal ganglia and  thalami. Vascular: Poorly assessed on the acquired sequences. Skull and upper cervical spine: Limited assessment for calvarial lesions on the acquired sequences. No marrow signal abnormality identified.  Sinuses/Orbits: Limited assessment of the orbits on the acquired sequences. Partial opacification of the left maxillary sinus. These results were called by telephone at the time of interpretation on 12/12/2019 at 12:59 pm to provider [facility] , who verbally acknowledged these results. IMPRESSION: Limited protocol, motion degraded examination. Moderate to large acute infarct within the left MCA vascular territory, as detailed. Multiple small additional acute infarcts within the bilateral frontal and parietooccipital lobes, right basal ganglia and cerebellum. Correlate for an embolic phenomenon. Chronic right temporal occipital cortically based infarct. Background of moderate chronic small vessel ischemic disease. Chronic lacunar infarcts within the bilateral basal ganglia and thalami. Electronically Signed   By: Jackey Loge DO   On: 12/12/2019 12:58   CT C-SPINE NO CHARGE  Result Date: 12/12/2019 CLINICAL DATA:  Code stroke.  Patient found down. EXAM: CT CERVICAL SPINE WITHOUT CONTRAST TECHNIQUE: Multidetector CT imaging of the cervical spine was performed without intravenous contrast. Multiplanar CT image reconstructions were also generated. COMPARISON:  Same day noncontrast CT head as well as CT angiogram head/neck and CT perfusion head. FINDINGS: Alignment: Cervical levocurvature with incompletely imaged thoracic dextrocurvature. No significant spondylolisthesis. Leftward rotation of C1 upon C2 may be positional at the time of examination. Skull base and vertebrae: The basion-dental and atlanto-dental intervals are maintained.No evidence of acute fracture to the cervical spine. Soft tissues and spinal canal: No prevertebral fluid or swelling. No visible canal hematoma. Disc levels: Cervical spondylosis with multilevel disc height loss, posterior disc osteophytes, uncovertebral and facet hypertrophy. There also prominent multilevel ventral osteophytes within the cervical and visualized upper thoracic spine.  Upper chest: No consolidation within the imaged lung apices. No visible pneumothorax. Mild interstitial prominence within the medial left lung apex is nonspecific. IMPRESSION: 1. No evidence of acute fracture to the cervical spine. 2. Leftward rotation of C1 upon C2 may be positional at the time of examination. Clinical correlation is recommended. 3. Cervical spondylosis as described. 4. Nonspecific interstitial prominence within the partially imaged medial left lung apex. Chest radiographs are recommended for further evaluation. Electronically Signed   By: Jackey Loge DO   On: 12/12/2019 13:38   CT Code Stroke Cerebral Perfusion with contrast  Result Date: 12/12/2019 CLINICAL DATA:  Code stroke.  Right-sided weakness. EXAM: CT ANGIOGRAPHY HEAD AND NECK CT PERFUSION BRAIN TECHNIQUE: Multidetector CT imaging of the head and neck was performed using the standard protocol during bolus administration of intravenous contrast. Multiplanar CT image reconstructions and MIPs were obtained to evaluate the vascular anatomy. Carotid stenosis measurements (when applicable) are obtained utilizing NASCET criteria, using the distal internal carotid diameter as the denominator. Multiphase CT imaging of the brain was performed following IV bolus contrast injection. Subsequent parametric perfusion maps were calculated using RAPID software. CONTRAST:  Administered intravenous contrast not known at this time. COMPARISON:  Noncontrast head CT performed concurrently. FINDINGS: CTA NECK FINDINGS Aortic arch: Standard aortic branching. Soft and calcified plaque within the visualized aortic arch and proximal major branch vessels of the neck. Right carotid system: CCA and ICA patent within the neck without significant stenosis. Minimal calcified plaque at the carotid bifurcation. Partially retropharyngeal course of the ICA. Left carotid system: CCA and ICA patent within the neck without significant stenosis. Minimal soft plaque within the  CCA. Minimal mixed plaque at the carotid bifurcation. Vertebral arteries: Codominant. Calcified plaque  at the bilateral vertebral artery origins without ostial stenosis. The right vertebral artery is patent throughout the neck with no significant atherosclerotic narrowing. There is moderate narrowing of the right vertebral artery at the C3-C4 level due to prominent uncinate and facet hypertrophy (series 7, image 202). The left vertebral artery is patent throughout the neck without significant stenosis. Skeleton: No acute bony abnormality. Cervical spondylosis with multilevel posterior disc osteophytes, uncovertebral and facet hypertrophy. There are also prominent multilevel ventral osteophytes. Other neck: No neck mass or cervical lymphadenopathy. Upper chest: No consolidation within the imaged lung apices. Review of the MIP images confirms the above findings CTA HEAD FINDINGS Anterior circulation: The bilateral intracranial internal carotid arteries are patent with scattered calcified plaque but no significant stenosis. The right middle and anterior cerebral arteries are patent without proximal branch occlusion. 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 The left anterior cerebral artery is patent without proximal branch occlusion. Posterior circulation: The intracranial vertebral arteries are patent bilaterally. Mild atherosclerotic narrowing of the intracranial left vertebral artery. The basilar artery is patent without significant stenosis. Bilateral posterior cerebral arteries are patent without proximal branch occlusion. High-grade stenosis within the P3 segment on the right Venous sinuses: Within limitations of contrast timing, no convincing thrombus. Anatomic variants: Posterior communicating arteries are poorly delineated and may be hypoplastic or absent bilaterally. Review of the MIP images confirms the above findings CT Brain  Perfusion Findings: ASPECTS: 10 CBF (<30%) Volume: 25mL Perfusion (Tmax>6.0s) volume: 150mL Mismatch Volume: 80 mL Infarction Location:Left MCA vascular territory. These results were called by telephone at the time of interpretation on 12/12/2019 at 12:15 pm to provider Dr. Rory Percy, who verbally acknowledged these results. IMPRESSION: CTA neck: 1. The bilateral common and internal carotid arteries are patent without significant stenosis. Mild atherosclerotic plaque within the bilateral carotid systems, as described. 2. Moderate narrowing of the right vertebral artery at the C3-C4 level due to uncinate/facet hypertrophy. No significant atherosclerotic narrowing of the vertebral arteries within the neck. CTA head: 1. 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 2. No other proximal large vessel occlusion identified. 3. High-grade focal stenosis within the P3 right PCA of uncertain chronicity. CT perfusion head: 1. Significant motion degradation may limit reliability of the perfusion data. 2. The perfusion software identifies an 88 mL core infarct in the left MCA territory. The perfusion software identifies a 168 mL region of critically hypoperfused parenchyma in the left MCA territory. Reported mismatch volume 80 mL. Reported mismatch ratio 1.9. This suggest a much larger core infarct than is identifiable on the noncontrast head CT. Electronically Signed   By: Kellie Simmering DO   On: 12/12/2019 12:28   CT HEAD CODE STROKE WO CONTRAST  Result Date: 12/12/2019 CLINICAL DATA:  Code stroke. Neuro deficit, acute, stroke suspected. Sudden onset right-sided weakness, aphasia, left gaze EXAM: CT HEAD WITHOUT CONTRAST TECHNIQUE: Contiguous axial images were obtained from the base of the skull through the vertex without intravenous contrast. COMPARISON:  No pertinent prior studies available for comparison. FINDINGS: Brain: No evidence of acute intracranial hemorrhage.  No acute demarcated cortical infarct is identified. Chronic appearing right temporal occipital lobe cortically based infarct. Ill-defined hypoattenuation within the cerebral white matter is nonspecific, but consistent with chronic small vessel ischemic disease. Age-indeterminate lacunar infarcts within the right basal ganglia and left thalamus. Mild generalized parenchymal atrophy. No evidence of intracranial mass. No midline shift. Probable thin  right frontoparietal hygroma or chronic subdural hematoma measuring up to 5 mm (series 5, image 28). Vascular: A dense M1 left MCA is questioned (series 3, image 17). Atherosclerotic calcifications. Skull: Normal. Negative for fracture or focal lesion. Sinuses/Orbits: Visualized orbits demonstrate no acute abnormality. Partial opacification of the left maxillary sinus. Mucous retention cyst within the inferior right maxillary sinus. No significant mastoid effusion. These results were called by telephone at the time of interpretation on 12/12/2019 at 12:06 pm to provider Dr. Wilford CornerArora, who verbally acknowledged these results. IMPRESSION: No acute intracranial hemorrhage or acute demarcated cortical infarction identified. Suspicion for hyperdense M1 left MCA. ASPECTS 10 for left MCA vascular territory. Age-indeterminate lacunar infarcts within the right basal ganglia and left thalamus. 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 Chronic appearing cortically based infarct within the right temporal occipital lobes. Background generalized parenchymal atrophy and chronic small vessel ischemic disease. Electronically Signed   By: Jackey LogeKyle  Golden DO   On: 12/12/2019 12:09    EKG: Independently reviewed.  Sinus tachycardia with rate 113; RBBB, LPFB    Labs on Admission: I have personally reviewed the available labs and imaging studies at the time of the admission.  Pertinent labs:   CO2 21 Glucose 189 BUN 22/Creatinine 1.56/GFR  48 AP 151 Albumin 3.3 CK 46 WBC 11.6 Hgb 11.7 INR 1.3 Respiratory panel PCR negative  Assessment/Plan Principal Problem:   Acute ischemic left MCA stroke (HCC) Active Problems:   Essential hypertension   Dyslipidemia   DNR (do not resuscitate)    MCA stroke -This appears to be a devastating stroke and it is not clear how much function the patient will regain -MRI confirms moderate to large MCA infarct with scattered other acute infarcts -Will admit to progressive care unit for further CVA evaluation -Telemetry monitoring -Echo -Risk stratification with FLP -ASA daily -Neurology consult -PT/OT/ST/Nutrition Consults -SW consult for placement -If patient does not show significant improvement and ability to regain function with therapy quickly, per POA he would prefer to transition to comfort measures - NO feeding tube  HTN -Allow permissive HTN for now -Treat BP only if >220/120, and then with goal of 15% reduction -Hold ACE and plan to restart in 48-72 hours if able to tolerate PO   HLD -Check FLP -Resume Lipitor when able to take PO  DNR -No tubes or lines or machines -She thinks he would want to be comfortable if he is not improving -This patient has a very poor overall prognosis and this has been conveyed to his POA    Note: This patient has been tested and is negative for the novel coronavirus COVID-19.    DVT prophylaxis:  SCDs  Code Status: DNR - confirmed with family Family Communication: None present; his landlord has been his POA and caregiver for the last 11 years and I spoke with her (as did neurology) Disposition Plan:  To be determined Consults called: Neurology; PT/OT/ST/TOC team  Admission status: Admit - It is my clinical opinion that admission to INPATIENT is reasonable and necessary because of the expectation that this patient will require hospital care that crosses at least 2 midnights to treat this condition based on the medical complexity of  the problems presented.  Given the aforementioned information, the predictability of an adverse outcome is felt to be significant.   Jonah BlueJennifer Sanuel Ladnier MD [facility]   How to contact the [facility] Attending or Consulting provider 7A - 7P or covering provider during after  hours 7P -7A, for this patient?  1. Check the care team in [facility] and look for a) attending/consulting [facility] provider listed and b) the [facility] team listed 2. Log into [email or web address] and use 's universal password to access. If you do not have the password, please contact the hospital operator. 3. Locate the [facility] provider you are looking for under [facility] and page to a number that you can be directly reached. 4. If you still have difficulty reaching the provider, please page the [facility] (Director on Call) for the Hospitalists listed on amion for assistance.   12/12/2019, 5:22 PM

## 2019-12-12 NOTE — ED Notes (Signed)
Main lab to add on CK

## 2019-12-12 NOTE — ED Triage Notes (Signed)
Pt BIB [facility] from home. Per EMS pt was found on floor by friend. LSW yesterday 1400. Per EMS right sided weakness.

## 2019-12-13 ENCOUNTER — Inpatient Hospital Stay (HOSPITAL_COMMUNITY): Payer: Medicare Other

## 2019-12-13 DIAGNOSIS — I63512 Cerebral infarction due to unspecified occlusion or stenosis of left middle cerebral artery: Secondary | ICD-10-CM

## 2019-12-13 DIAGNOSIS — E785 Hyperlipidemia, unspecified: Secondary | ICD-10-CM

## 2019-12-13 DIAGNOSIS — I6389 Other cerebral infarction: Secondary | ICD-10-CM

## 2019-12-13 DIAGNOSIS — I10 Essential (primary) hypertension: Secondary | ICD-10-CM

## 2019-12-13 DIAGNOSIS — R627 Adult failure to thrive: Secondary | ICD-10-CM

## 2019-12-13 LAB — LIPID PANEL
Cholesterol: 166 mg/dL (ref 0–200)
HDL: 51 mg/dL (ref >40)
LDL Cholesterol: 94 mg/dL (ref 0–99)
Total CHOL/HDL Ratio: 3.3 RATIO
Triglycerides: 105 mg/dL (ref <150)
VLDL: 21 mg/dL (ref 0–40)

## 2019-12-13 LAB — ECHOCARDIOGRAM COMPLETE

## 2019-12-13 MED ORDER — PERFLUTREN LIPID MICROSPHERE
1.0000 mL | INTRAVENOUS | Status: AC | PRN
  Administered 2019-12-13: 3 mL via INTRAVENOUS
  Filled 2019-12-13: qty 10

## 2019-12-13 NOTE — ED Notes (Signed)
Echo at bedside

## 2019-12-13 NOTE — ED Notes (Signed)
Updated pt's friend and POA, Sallye Ober, on pt's status and plan of care

## 2019-12-13 NOTE — ED Notes (Signed)
ED TO INPATIENT HANDOFF REPORT  ED Nurse Name and Phone #: Davene Costain [phone]  S Name/Age/Gender Bernard Schmidt 84 y.o. male Room/Bed: 034C/034C  Code Status   Code Status: DNR  Home/SNF/Other   Is this baseline? No   Triage Complete: Triage complete  Chief Complaint CVA (cerebral vascular accident) [facility]) [I63.9]  Triage Note Pt BIB [facility] from home. Per EMS pt was found on floor by friend. LSW yesterday 1400. Per EMS right sided weakness.     Allergies No Known Allergies  Level of Care/Admitting Diagnosis ED Disposition    ED Disposition Condition [facility] Area: [place] [[id number]]  Level of Care: Progressive [102]  Admit to Progressive based on following criteria: NEUROLOGICAL AND NEUROSURGICAL complex patients with significant risk of instability, who do not meet ICU criteria, yet require close observation or frequent assessment (< / = every 2 - 4 hours) with medical / nursing intervention.  Covid Evaluation: Confirmed COVID Negative  Diagnosis: CVA (cerebral vascular accident) [facility]) [[id number]]  Admitting Physician: Karmen Bongo [[id number]]  Attending Physician: Karmen Bongo [[id number]]  Estimated length of stay: 3 - 4 days  Certification:: I certify this patient will need inpatient services for at least 2 midnights       B Medical/Surgery History Past Medical History:  Diagnosis Date  . HLD (hyperlipidemia)   . HTN (hypertension)    History reviewed. No pertinent surgical history.   A IV Location/Drains/Wounds Patient Lines/Drains/Airways Status   Active Line/Drains/Airways    Name:   Placement date:   Placement time:   Site:   Days:   Peripheral IV 12/12/19 Right Antecubital   12/12/19    1212    Antecubital   1          Intake/Output Last 24 hours No intake or output data in the 24 hours ending 12/13/19 1813  Labs/Imaging Results for orders placed or performed during the hospital encounter of 12/12/19 (from the past 48  hour(s))  CBG monitoring, ED     Status: Abnormal   Collection Time: 12/12/19 11:40 AM  Result Value Ref Range   Glucose-Capillary 165 (H) 70 - 99 mg/dL   Comment 1 Notify RN    Comment 2 Document in Chart   Protime-INR     Status: Abnormal   Collection Time: 12/12/19 11:45 AM  Result Value Ref Range   Prothrombin Time 16.1 (H) 11.4 - 15.2 seconds   INR 1.3 (H) 0.8 - 1.2    Comment: (NOTE) INR goal varies based on device and disease states. Performed at [facility], [place] [address], [place], [place] [address]   APTT     Status: None   Collection Time: 12/12/19 11:45 AM  Result Value Ref Range   aPTT 24 24 - 36 seconds    Comment: Performed at [facility] [address] [place], [place] [address]  CBC     Status: Abnormal   Collection Time: 12/12/19 11:45 AM  Result Value Ref Range   WBC 11.6 (H) 4.0 - 10.5 K/uL   RBC 4.05 (L) 4.22 - 5.81 MIL/uL   Hemoglobin 11.7 (L) 13.0 - 17.0 g/dL   HCT 36.8 (L) 39.0 - 52.0 %   MCV 90.9 80.0 - 100.0 fL   MCH 28.9 26.0 - 34.0 pg   MCHC 31.8 30.0 - 36.0 g/dL   RDW 13.3 11.5 - 15.5 %   Platelets 193 150 - 400 K/uL   nRBC 0.0 0.0 -  0.2 %    Comment: Performed at [facility], [address], [place], [place] [address]  Differential     Status: Abnormal   Collection Time: 12/12/19 11:45 AM  Result Value Ref Range   Neutrophils Relative % 89 %   Neutro Abs 10.3 (H) 1.7 - 7.7 K/uL   Lymphocytes Relative 6 %   Lymphs Abs 0.6 (L) 0.7 - 4.0 K/uL   Monocytes Relative 5 %   Monocytes Absolute 0.6 0.1 - 1.0 K/uL   Eosinophils Relative 0 %   Eosinophils Absolute 0.0 0.0 - 0.5 K/uL   Basophils Relative 0 %   Basophils Absolute 0.0 0.0 - 0.1 K/uL   Immature Granulocytes 0 %   Abs Immature Granulocytes 0.04 0.00 - 0.07 K/uL    Comment: Performed at [facility], [address], [place], [place] [address]  Comprehensive metabolic panel     Status: Abnormal   Collection Time: 12/12/19 11:45 AM  Result Value Ref Range    Sodium 137 135 - 145 mmol/L   Potassium 4.3 3.5 - 5.1 mmol/L   Chloride 105 98 - 111 mmol/L   CO2 21 (L) 22 - 32 mmol/L   Glucose, Bld 189 (H) 70 - 99 mg/dL   BUN 22 8 - 23 mg/dL   Creatinine, Ser 5.62 (H) 0.61 - 1.24 mg/dL   Calcium 9.2 8.9 - 13.0 mg/dL   Total Protein 6.6 6.5 - 8.1 g/dL   Albumin 3.3 (L) 3.5 - 5.0 g/dL   AST 21 15 - 41 U/L   ALT 13 0 - 44 U/L   Alkaline Phosphatase 151 (H) 38 - 126 U/L   Total Bilirubin 0.9 0.3 - 1.2 mg/dL   GFR calc non Af Amer 41 (L) >60 mL/min   GFR calc Af Amer 48 (L) >60 mL/min   Anion gap 11 5 - 15    Comment: Performed at [facility], [address] [place], [place] [address]  CK     Status: Abnormal   Collection Time: 12/12/19 11:45 AM  Result Value Ref Range   Total CK 46 (L) 49 - 397 U/L    Comment: Performed at [facility], [address] [place], [place] [address]  I-stat chem 8, ED     Status: Abnormal   Collection Time: 12/12/19 11:54 AM  Result Value Ref Range   Sodium 138 135 - 145 mmol/L   Potassium 4.2 3.5 - 5.1 mmol/L   Chloride 106 98 - 111 mmol/L   BUN 22 8 - 23 mg/dL   Creatinine, Ser 9.52 (H) 0.61 - 1.24 mg/dL   Glucose, Bld 841 (H) 70 - 99 mg/dL   Calcium, Ion 3.24 4.01 - 1.40 mmol/L   TCO2 23 22 - 32 mmol/L   Hemoglobin 12.2 (L) 13.0 - 17.0 g/dL   HCT 02.7 (L) 25.3 - 66.4 %  Respiratory Panel by RT PCR (Flu A&B, Covid) - Nasopharyngeal Swab     Status: None   Collection Time: 12/12/19 12:08 PM   Specimen: Nasopharyngeal Swab  Result Value Ref Range   SARS Coronavirus 2 by RT PCR NEGATIVE NEGATIVE    Comment: (NOTE) SARS-CoV-2 target nucleic acids are NOT DETECTED. The SARS-CoV-2 RNA is generally detectable in upper respiratoy specimens during the acute phase of infection. The lowest concentration of SARS-CoV-2 viral copies this assay can detect is 131 copies/mL. A negative result does not preclude SARS-Cov-2 infection and should not be used as the  sole basis for treatment or other patient  management decisions. A negative result may occur with  improper specimen collection/handling, submission of specimen other than nasopharyngeal swab, presence of viral mutation(s) within the areas targeted by this assay, and inadequate number of viral copies (<131 copies/mL). A negative result must be combined with clinical observations, patient history, and epidemiological information. The expected result is Negative. Fact Sheet for Patients:  https://www.moore.com/ Fact Sheet for Healthcare Providers:  https://www.young.biz/ This test is not yet ap proved or cleared by the [place] FDA and  has been authorized for detection and/or diagnosis of SARS-CoV-2 by FDA under an Emergency Use Authorization (EUA). This EUA will remain  in effect (meaning this test can be used) for the duration of the COVID-19 declaration under Section 564(b)(1) of the Act, 21 U.S.C. section 360bbb-3(b)(1), unless the authorization is terminated or revoked sooner.    Influenza A by PCR NEGATIVE NEGATIVE   Influenza B by PCR NEGATIVE NEGATIVE    Comment: (NOTE) The Xpert Xpress SARS-CoV-2/FLU/RSV assay is intended as an aid in  the diagnosis of influenza from Nasopharyngeal swab specimens and  should not be used as a sole basis for treatment. Nasal washings and  aspirates are unacceptable for Xpert Xpress SARS-CoV-2/FLU/RSV  testing. Fact Sheet for Patients: https://www.moore.com/ Fact Sheet for Healthcare Providers: https://www.young.biz/ This test is not yet approved or cleared by the [place] FDA and  has been authorized for detection and/or diagnosis of SARS-CoV-2 by  FDA under an Emergency Use Authorization (EUA). This EUA will remain  in effect (meaning this test can be used) for the duration of the  Covid-19 declaration under Section 564(b)(1) of the Act, 21  U.S.C. section 360bbb-3(b)(1), unless the authorization  is  terminated or revoked. Performed at [facility], [address], [place], [place] [address]   Lipid panel     Status: None   Collection Time: 12/13/19  4:44 AM  Result Value Ref Range   Cholesterol 166 0 - 200 mg/dL   Triglycerides 604 <540 mg/dL   HDL 51 >98 mg/dL   Total CHOL/HDL Ratio 3.3 RATIO   VLDL 21 0 - 40 mg/dL   LDL Cholesterol 94 0 - 99 mg/dL    Comment:        Total Cholesterol/HDL:CHD Risk Coronary Heart Disease Risk Table                     Men   Women  1/2 Average Risk   3.4   3.3  Average Risk       5.0   4.4  2 X Average Risk   9.6   7.1  3 X Average Risk  23.4   11.0        Use the calculated Patient Ratio above and the CHD Risk Table to determine the patient's CHD Risk.        ATP III CLASSIFICATION (LDL):  <100     mg/dL   Optimal  119-147  mg/dL   Near or Above                    Optimal  130-159  mg/dL   Borderline  829-562  mg/dL   High  >130     mg/dL   Very High Performed at [facility], [address], [place], [place] [address]    CT Code Stroke CTA Head W/WO contrast  Result Date: 12/12/2019 CLINICAL DATA:  Code stroke.  Right-sided weakness. EXAM: CT ANGIOGRAPHY HEAD AND NECK CT PERFUSION BRAIN TECHNIQUE: Multidetector CT imaging of the head and neck was performed using the standard protocol during bolus administration of intravenous contrast. Multiplanar CT image reconstructions and MIPs were obtained to evaluate the vascular anatomy. Carotid stenosis measurements (when applicable) are obtained utilizing NASCET criteria, using the distal internal carotid diameter as the denominator. Multiphase CT imaging of the brain was performed following IV bolus contrast injection. Subsequent parametric perfusion maps were calculated using RAPID software. CONTRAST:  Administered intravenous contrast not known at this time. COMPARISON:  Noncontrast head CT performed concurrently. FINDINGS: CTA NECK FINDINGS Aortic arch: Standard aortic  branching. Soft and calcified plaque within the visualized aortic arch and proximal major branch vessels of the neck. Right carotid system: CCA and ICA patent within the neck without significant stenosis. Minimal calcified plaque at the carotid bifurcation. Partially retropharyngeal course of the ICA. Left carotid system: CCA and ICA patent within the neck without significant stenosis. Minimal soft plaque within the CCA. Minimal mixed plaque at the carotid bifurcation. Vertebral arteries: Codominant. Calcified plaque at the bilateral vertebral artery origins without ostial stenosis. The right vertebral artery is patent throughout the neck with no significant atherosclerotic narrowing. There is moderate narrowing of the right vertebral artery at the C3-C4 level due to prominent uncinate and facet hypertrophy (series 7, image 202). The left vertebral artery is patent throughout the neck without significant stenosis. Skeleton: No acute bony abnormality. Cervical spondylosis with multilevel posterior disc osteophytes, uncovertebral and facet hypertrophy. There are also prominent multilevel ventral osteophytes. Other neck: No neck mass or cervical lymphadenopathy. Upper chest: No consolidation within the imaged lung apices. Review of the MIP images confirms the above findings CTA HEAD FINDINGS Anterior circulation: The bilateral intracranial internal carotid arteries are patent with scattered calcified plaque but no significant stenosis. The right middle and anterior cerebral arteries are patent without proximal branch occlusion. 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 The left anterior cerebral artery is patent without proximal branch occlusion. Posterior circulation: The intracranial vertebral arteries are patent bilaterally. Mild atherosclerotic narrowing of the intracranial left vertebral artery. The basilar artery is patent without  significant stenosis. Bilateral posterior cerebral arteries are patent without proximal branch occlusion. High-grade stenosis within the P3 segment on the right Venous sinuses: Within limitations of contrast timing, no convincing thrombus. Anatomic variants: Posterior communicating arteries are poorly delineated and may be hypoplastic or absent bilaterally. Review of the MIP images confirms the above findings CT Brain Perfusion Findings: ASPECTS: 10 CBF (<30%) Volume: 88mL Perfusion (Tmax>6.0s) volume: 168mL Mismatch Volume: 80 mL Infarction Location:Left MCA vascular territory. These results were called by telephone at the time of interpretation on 12/12/2019 at 12:15 pm to provider Dr. Wilford CornerArora, who verbally acknowledged these results. IMPRESSION: CTA neck: 1. The bilateral common and internal carotid arteries are patent without significant stenosis. Mild atherosclerotic plaque within the bilateral carotid systems, as described. 2. Moderate narrowing of the right vertebral artery at the C3-C4 level due to uncinate/facet hypertrophy. No significant atherosclerotic narrowing of the vertebral arteries within the neck. CTA head: 1. 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 2. No other proximal large vessel occlusion identified. 3. High-grade focal stenosis within the P3 right PCA of uncertain chronicity. CT perfusion head: 1. Significant motion degradation may limit reliability of the perfusion data. 2. The perfusion software identifies an 88 mL core infarct in  the left MCA territory. The perfusion software identifies a 168 mL region of critically hypoperfused parenchyma in the left MCA territory. Reported mismatch volume 80 mL. Reported mismatch ratio 1.9. This suggest a much larger core infarct than is identifiable on the noncontrast head CT. Electronically Signed   By: Jackey Loge DO   On: 12/12/2019 12:28   CT Code Stroke CTA Neck W/WO  contrast  Result Date: 12/12/2019 CLINICAL DATA:  Code stroke.  Right-sided weakness. EXAM: CT ANGIOGRAPHY HEAD AND NECK CT PERFUSION BRAIN TECHNIQUE: Multidetector CT imaging of the head and neck was performed using the standard protocol during bolus administration of intravenous contrast. Multiplanar CT image reconstructions and MIPs were obtained to evaluate the vascular anatomy. Carotid stenosis measurements (when applicable) are obtained utilizing NASCET criteria, using the distal internal carotid diameter as the denominator. Multiphase CT imaging of the brain was performed following IV bolus contrast injection. Subsequent parametric perfusion maps were calculated using RAPID software. CONTRAST:  Administered intravenous contrast not known at this time. COMPARISON:  Noncontrast head CT performed concurrently. FINDINGS: CTA NECK FINDINGS Aortic arch: Standard aortic branching. Soft and calcified plaque within the visualized aortic arch and proximal major branch vessels of the neck. Right carotid system: CCA and ICA patent within the neck without significant stenosis. Minimal calcified plaque at the carotid bifurcation. Partially retropharyngeal course of the ICA. Left carotid system: CCA and ICA patent within the neck without significant stenosis. Minimal soft plaque within the CCA. Minimal mixed plaque at the carotid bifurcation. Vertebral arteries: Codominant. Calcified plaque at the bilateral vertebral artery origins without ostial stenosis. The right vertebral artery is patent throughout the neck with no significant atherosclerotic narrowing. There is moderate narrowing of the right vertebral artery at the C3-C4 level due to prominent uncinate and facet hypertrophy (series 7, image 202). The left vertebral artery is patent throughout the neck without significant stenosis. Skeleton: No acute bony abnormality. Cervical spondylosis with multilevel posterior disc osteophytes, uncovertebral and facet  hypertrophy. There are also prominent multilevel ventral osteophytes. Other neck: No neck mass or cervical lymphadenopathy. Upper chest: No consolidation within the imaged lung apices. Review of the MIP images confirms the above findings CTA HEAD FINDINGS Anterior circulation: The bilateral intracranial internal carotid arteries are patent with scattered calcified plaque but no significant stenosis. The right middle and anterior cerebral arteries are patent without proximal branch occlusion. 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 The left anterior cerebral artery is patent without proximal branch occlusion. Posterior circulation: The intracranial vertebral arteries are patent bilaterally. Mild atherosclerotic narrowing of the intracranial left vertebral artery. The basilar artery is patent without significant stenosis. Bilateral posterior cerebral arteries are patent without proximal branch occlusion. High-grade stenosis within the P3 segment on the right Venous sinuses: Within limitations of contrast timing, no convincing thrombus. Anatomic variants: Posterior communicating arteries are poorly delineated and may be hypoplastic or absent bilaterally. Review of the MIP images confirms the above findings CT Brain Perfusion Findings: ASPECTS: 10 CBF (<30%) Volume: 68mL Perfusion (Tmax>6.0s) volume: Mismatch Volume: 80 mL Infarction Location:Left MCA vascular territory. These results were called by telephone at the time of interpretation on 12/12/2019 at 12:15 pm to provider Dr. Wilford Corner, who verbally acknowledged these results. IMPRESSION: CTA neck: 1. The bilateral common and internal carotid arteries are patent without significant stenosis. Mild atherosclerotic plaque within the bilateral carotid systems, as described. 2. Moderate narrowing of the right vertebral artery at the C3-C4  level due to uncinate/facet hypertrophy. No  significant atherosclerotic narrowing of the vertebral arteries within the neck. CTA head: 1. 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 2. No other proximal large vessel occlusion identified. 3. High-grade focal stenosis within the P3 right PCA of uncertain chronicity. CT perfusion head: 1. Significant motion degradation may limit reliability of the perfusion data. 2. The perfusion software identifies an 88 mL core infarct in the left MCA territory. The perfusion software identifies a 168 mL region of critically hypoperfused parenchyma in the left MCA territory. Reported mismatch volume 80 mL. Reported mismatch ratio 1.9. This suggest a much larger core infarct than is identifiable on the noncontrast head CT. Electronically Signed   By: Jackey Loge DO   On: 12/12/2019 12:28   MR BRAIN WO CONTRAST  Result Date: 12/12/2019 CLINICAL DATA:  Neuro deficit, acute, stroke suspected. EXAM: MRI HEAD WITHOUT CONTRAST TECHNIQUE: Multiplanar, multiecho pulse sequences of the brain and surrounding structures were obtained without intravenous contrast. COMPARISON:  Noncontrast head CT and CT angiogram head/neck as well as CT perfusion performed earlier the same day. FINDINGS: Brain: A limited protocol utilizing only axial and coronal diffusion-weighted imaging, as well as axial T2 FLAIR imaging is performed. The axial T2 FLAIR sequence is significantly motion degraded, limiting evaluation. There is a moderate to large region of cortical/subcortical restricted diffusion within the left MCA vascular territory involving the left temporal lobe, left insula and left frontal operculum. To a lesser degree there is acute infarction change within the left parietal cortex. There is patchy, mild corresponding T2/FLAIR hyperintensity. There are multiple additional punctate cortically based infarcts within the high left frontal lobe, right frontal lobe and bilateral  parietooccipital lobes. A small acute infarct is also questioned within the right basal ganglia. There are also several small acute infarcts within the cerebellar hemispheres. Chronic right temporal occipital lobe cortically based infarct. Background of moderate chronic small vessel ischemic disease with chronic lacunar infarcts within bilateral basal ganglia and thalami. Vascular: Poorly assessed on the acquired sequences. Skull and upper cervical spine: Limited assessment for calvarial lesions on the acquired sequences. No marrow signal abnormality identified. Sinuses/Orbits: Limited assessment of the orbits on the acquired sequences. Partial opacification of the left maxillary sinus. These results were called by telephone at the time of interpretation on 12/12/2019 at 12:59 pm to provider [facility] , who verbally acknowledged these results. IMPRESSION: Limited protocol, motion degraded examination. Moderate to large acute infarct within the left MCA vascular territory, as detailed. Multiple small additional acute infarcts within the bilateral frontal and parietooccipital lobes, right basal ganglia and cerebellum. Correlate for an embolic phenomenon. Chronic right temporal occipital cortically based infarct. Background of moderate chronic small vessel ischemic disease. Chronic lacunar infarcts within the bilateral basal ganglia and thalami. Electronically Signed   By: Jackey Loge DO   On: 12/12/2019 12:58   CT C-SPINE NO CHARGE  Result Date: 12/12/2019 CLINICAL DATA:  Code stroke.  Patient found down. EXAM: CT CERVICAL SPINE WITHOUT CONTRAST TECHNIQUE: Multidetector CT imaging of the cervical spine was performed without intravenous contrast. Multiplanar CT image reconstructions were also generated. COMPARISON:  Same day noncontrast CT head as well as CT angiogram head/neck and CT perfusion head. FINDINGS: Alignment: Cervical levocurvature with incompletely imaged thoracic dextrocurvature. No significant  spondylolisthesis. Leftward rotation of C1 upon C2 may be positional at the time of examination. Skull base and vertebrae: The basion-dental and atlanto-dental intervals are maintained.No evidence of acute  fracture to the cervical spine. Soft tissues and spinal canal: No prevertebral fluid or swelling. No visible canal hematoma. Disc levels: Cervical spondylosis with multilevel disc height loss, posterior disc osteophytes, uncovertebral and facet hypertrophy. There also prominent multilevel ventral osteophytes within the cervical and visualized upper thoracic spine. Upper chest: No consolidation within the imaged lung apices. No visible pneumothorax. Mild interstitial prominence within the medial left lung apex is nonspecific. IMPRESSION: 1. No evidence of acute fracture to the cervical spine. 2. Leftward rotation of C1 upon C2 may be positional at the time of examination. Clinical correlation is recommended. 3. Cervical spondylosis as described. 4. Nonspecific interstitial prominence within the partially imaged medial left lung apex. Chest radiographs are recommended for further evaluation. Electronically Signed   By: Jackey Loge DO   On: 12/12/2019 13:38   CT Code Stroke Cerebral Perfusion with contrast  Result Date: 12/12/2019 CLINICAL DATA:  Code stroke.  Right-sided weakness. EXAM: CT ANGIOGRAPHY HEAD AND NECK CT PERFUSION BRAIN TECHNIQUE: Multidetector CT imaging of the head and neck was performed using the standard protocol during bolus administration of intravenous contrast. Multiplanar CT image reconstructions and MIPs were obtained to evaluate the vascular anatomy. Carotid stenosis measurements (when applicable) are obtained utilizing NASCET criteria, using the distal internal carotid diameter as the denominator. Multiphase CT imaging of the brain was performed following IV bolus contrast injection. Subsequent parametric perfusion maps were calculated using RAPID software. CONTRAST:  Administered  intravenous contrast not known at this time. COMPARISON:  Noncontrast head CT performed concurrently. FINDINGS: CTA NECK FINDINGS Aortic arch: Standard aortic branching. Soft and calcified plaque within the visualized aortic arch and proximal major branch vessels of the neck. Right carotid system: CCA and ICA patent within the neck without significant stenosis. Minimal calcified plaque at the carotid bifurcation. Partially retropharyngeal course of the ICA. Left carotid system: CCA and ICA patent within the neck without significant stenosis. Minimal soft plaque within the CCA. Minimal mixed plaque at the carotid bifurcation. Vertebral arteries: Codominant. Calcified plaque at the bilateral vertebral artery origins without ostial stenosis. The right vertebral artery is patent throughout the neck with no significant atherosclerotic narrowing. There is moderate narrowing of the right vertebral artery at the C3-C4 level due to prominent uncinate and facet hypertrophy (series 7, image 202). The left vertebral artery is patent throughout the neck without significant stenosis. Skeleton: No acute bony abnormality. Cervical spondylosis with multilevel posterior disc osteophytes, uncovertebral and facet hypertrophy. There are also prominent multilevel ventral osteophytes. Other neck: No neck mass or cervical lymphadenopathy. Upper chest: No consolidation within the imaged lung apices. Review of the MIP images confirms the above findings CTA HEAD FINDINGS Anterior circulation: The bilateral intracranial internal carotid arteries are patent with scattered calcified plaque but no significant stenosis. The right middle and anterior cerebral arteries are patent without proximal branch occlusion. 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 The left anterior cerebral artery is patent without proximal branch occlusion. Posterior circulation: The  intracranial vertebral arteries are patent bilaterally. Mild atherosclerotic narrowing of the intracranial left vertebral artery. The basilar artery is patent without significant stenosis. Bilateral posterior cerebral arteries are patent without proximal branch occlusion. High-grade stenosis within the P3 segment on the right Venous sinuses: Within limitations of contrast timing, no convincing thrombus. Anatomic variants: Posterior communicating arteries are poorly delineated and may be hypoplastic or absent bilaterally. Review of the MIP images confirms the above findings CT Brain Perfusion Findings:  ASPECTS: 10 CBF (<30%) Volume: 55mL Perfusion (Tmax>6.0s) volume: Mismatch Volume: 80 mL Infarction Location:Left MCA vascular territory. These results were called by telephone at the time of interpretation on 12/12/2019 at 12:15 pm to provider Dr. Wilford Corner, who verbally acknowledged these results. IMPRESSION: CTA neck: 1. The bilateral common and internal carotid arteries are patent without significant stenosis. Mild atherosclerotic plaque within the bilateral carotid systems, as described. 2. Moderate narrowing of the right vertebral artery at the C3-C4 level due to uncinate/facet hypertrophy. No significant atherosclerotic narrowing of the vertebral arteries within the neck. CTA head: 1. 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 2. No other proximal large vessel occlusion identified. 3. High-grade focal stenosis within the P3 right PCA of uncertain chronicity. CT perfusion head: 1. Significant motion degradation may limit reliability of the perfusion data. 2. The perfusion software identifies an 88 mL core infarct in the left MCA territory. The perfusion software identifies a 168 mL region of critically hypoperfused parenchyma in the left MCA territory. Reported mismatch volume 80 mL. Reported mismatch ratio 1.9. This suggest a much larger core  infarct than is identifiable on the noncontrast head CT. Electronically Signed   By: Jackey Loge DO   On: 12/12/2019 12:28   ECHOCARDIOGRAM COMPLETE  Result Date: 12/13/2019   ECHOCARDIOGRAM REPORT   Patient Name:   Bernard Schmidt Date of Exam: 12/13/2019 Medical Rec #:  [id number]       Height: Accession #:    [id number]      Weight: Date of Birth:  02-Jan-1936       BSA: Patient Age:    83 years        BP:           146/71 mmHg Patient Gender: M               HR:           105 bpm. Exam Location:  Inpatient Procedure: 2D Echo and Intracardiac Opacification Agent Indications:    Stroke 434.91/I163.9  History:        Patient has no prior history of Echocardiogram examinations.                 Risk Factors:Hypertension and Dyslipidemia.  Sonographer:    Ross Ludwig RDCS (AE) Referring Phys: [id number] JENNIFER YATES  Sonographer Comments: Suboptimal apical window and no subcostal window. Patient unable to respond to verbal requests. IMPRESSIONS  1. Left ventricular ejection fraction, by visual estimation, is 60 to 65%. The left ventricle has normal function. There is mildly increased left ventricular hypertrophy.  2. Definity contrast agent was given IV to delineate the left ventricular endocardial borders.  3. Left ventricular diastolic parameters are consistent with Grade I diastolic dysfunction (impaired relaxation).  4. The left ventricle has no regional wall motion abnormalities.  5. Global right ventricle has normal systolic function.The right ventricular size is normal. No increase in right ventricular wall thickness.  6. Left atrial size was normal.  7. Right atrial size was normal.  8. The mitral valve is normal in structure. No evidence of mitral valve regurgitation.  9. The tricuspid valve is normal in structure. 10. The aortic valve is normal in structure. Aortic valve regurgitation is not visualized. 11. The pulmonic valve was normal in structure. Pulmonic valve regurgitation is not visualized. 12. The atrial  septum is grossly normal. FINDINGS  Left Ventricle: Left ventricular ejection fraction, by visual estimation, is 60 to 65%. The  left ventricle has normal function. Definity contrast agent was given IV to delineate the left ventricular endocardial borders. The left ventricle has no regional wall motion abnormalities. There is mildly increased left ventricular hypertrophy. Left ventricular diastolic parameters are consistent with Grade I diastolic dysfunction (impaired relaxation). Right Ventricle: The right ventricular size is normal. No increase in right ventricular wall thickness. Global RV systolic function is has normal systolic function. Left Atrium: Left atrial size was normal in size. Right Atrium: Right atrial size was normal in size Pericardium: There is no evidence of pericardial effusion. Mitral Valve: The mitral valve is normal in structure. No evidence of mitral valve regurgitation. Tricuspid Valve: The tricuspid valve is normal in structure. Tricuspid valve regurgitation is not demonstrated. Aortic Valve: The aortic valve is normal in structure. Aortic valve regurgitation is not visualized. Aortic regurgitation PHT measures 346 msec. Aortic valve mean gradient measures 3.0 mmHg. Aortic valve peak gradient measures 6.2 mmHg. Aortic valve area, by VTI measures 3.53 cm. Pulmonic Valve: The pulmonic valve was normal in structure. Pulmonic valve regurgitation is not visualized. Pulmonic regurgitation is not visualized. Aorta: The aortic root and ascending aorta are structurally normal, with no evidence of dilitation. IAS/Shunts: The atrial septum is grossly normal.  LEFT VENTRICLE PLAX 2D LVIDd:         3.80 cm LVIDs:         2.80 cm LV PW:         1.30 cm LV IVS:        1.30 cm LVOT diam:     2.20 cm LV SV:         32 ml LVOT Area:     3.80 cm  RIGHT VENTRICLE RV Basal diam:  3.00 cm RV S prime:     14.10 cm/s TAPSE (M-mode): 1.8 cm LEFT ATRIUM             RIGHT ATRIUM LA diam:        3.60 cm RA Area:      10.20 cm LA Vol (A2C):   36.4 ml RA Volume:   19.80 ml LA Vol (A4C):   34.7 ml LA Biplane Vol: 37.5 ml  AORTIC VALVE AV Area (Vmax):    3.32 cm AV Area (Vmean):   3.28 cm AV Area (VTI):     3.53 cm AV Vmax:           124.00 cm/s AV Vmean:          77.600 cm/s AV VTI:            0.179 m AV Peak Grad:      6.2 mmHg AV Mean Grad:      3.0 mmHg LVOT Vmax:         108.24 cm/s LVOT Vmean:        67.000 cm/s LVOT VTI:          0.166 m LVOT/AV VTI ratio: 0.93 AI PHT:            346 msec  AORTA Ao Root diam: 2.90 cm  SHUNTS Systemic VTI:  0.17 m Systemic Diam: 2.20 cm  Kristeen Miss MD Electronically signed by Kristeen Miss MD Signature Date/Time: 12/13/2019/1:16:29 PM    Final    CT HEAD CODE STROKE WO CONTRAST  Result Date: 12/12/2019 CLINICAL DATA:  Code stroke. Neuro deficit, acute, stroke suspected. Sudden onset right-sided weakness, aphasia, left gaze EXAM: CT HEAD WITHOUT CONTRAST TECHNIQUE: Contiguous axial images were obtained from the base of the skull  through the vertex without intravenous contrast. COMPARISON:  No pertinent prior studies available for comparison. FINDINGS: Brain: No evidence of acute intracranial hemorrhage. No acute demarcated cortical infarct is identified. Chronic appearing right temporal occipital lobe cortically based infarct. Ill-defined hypoattenuation within the cerebral white matter is nonspecific, but consistent with chronic small vessel ischemic disease. Age-indeterminate lacunar infarcts within the right basal ganglia and left thalamus. Mild generalized parenchymal atrophy. No evidence of intracranial mass. No midline shift. Probable thin right frontoparietal hygroma or chronic subdural hematoma measuring up to 5 mm (series 5, image 28). Vascular: A dense M1 left MCA is questioned (series 3, image 17). Atherosclerotic calcifications. Skull: Normal. Negative for fracture or focal lesion. Sinuses/Orbits: Visualized orbits demonstrate no acute abnormality. Partial opacification of  the left maxillary sinus. Mucous retention cyst within the inferior right maxillary sinus. No significant mastoid effusion. These results were called by telephone at the time of interpretation on 12/12/2019 at 12:06 pm to provider Dr. Wilford CornerArora, who verbally acknowledged these results. IMPRESSION: No acute intracranial hemorrhage or acute demarcated cortical infarction identified. Suspicion for hyperdense M1 left MCA. ASPECTS 10 for left MCA vascular territory. Age-indeterminate lacunar infarcts within the right basal ganglia and left thalamus. 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 Chronic appearing cortically based infarct within the right temporal occipital lobes. Background generalized parenchymal atrophy and chronic small vessel ischemic disease. Electronically Signed   By: Jackey LogeKyle  Golden DO   On: 12/12/2019 12:09    Pending Labs Unresulted Labs (From admission, onward)   None      Vitals/Pain Today's Vitals   12/13/19 1639 12/13/19 1650 12/13/19 1700 12/13/19 1812  BP:   130/67   Pulse:   99   Resp:   (!) 23   Temp:  99.3 F (37.4 C)    TempSrc:  Rectal    SpO2:   98%   PainSc: Asleep   0-No pain    Isolation Precautions No active isolations  Medications Medications  latanoprost (XALATAN) 0.005 % ophthalmic solution 1 drop (1 drop Left Eye Given 12/12/19 2358)   stroke: mapping our early stages of recovery book (has no administration in time range)  0.9 %  sodium chloride infusion ( Intravenous New Bag/Given 12/13/19 1423)  acetaminophen (TYLENOL) tablet 650 mg (has no administration in time range)    Or  acetaminophen (TYLENOL) 160 MG/5ML solution 650 mg (has no administration in time range)    Or  acetaminophen (TYLENOL) suppository 650 mg (has no administration in time range)  aspirin suppository 300 mg (300 mg Rectal Given 12/13/19 1144)    Or  aspirin tablet 325 mg ( Oral See Alternative 12/13/19 1144)  perflutren lipid  microspheres (DEFINITY) IV suspension (3 mLs Intravenous Given 12/13/19 1051)  iohexol (OMNIPAQUE) 350 MG/ML injection 100 mL (100 mLs Intravenous Contrast Given 12/12/19 1211)    Mobility non-ambulatory High fall risk   Focused Assessments Neuro Assessment Handoff:  Swallow screen pass? No  Cardiac Rhythm: Normal sinus rhythm, Sinus tachycardia NIH Stroke Scale ( + Modified Stroke Scale Criteria)  Interval: Initial Level of Consciousness (1a.)   : Not alert, but arousable by minor stimulation to obey, answer, or respond LOC Questions (1b. )   +: Answers neither question correctly LOC Commands (1c. )   + : Performs neither task correctly Best Gaze (2. )  +: Forced deviation Visual (3. )  +: Bilateral hemianopia (blind including cortical blindness) Facial Palsy (4. )    :  Normal symmetrical movements Motor Arm, Left (5a. )   +: No drift Motor Arm, Right (5b. )   +: No movement Motor Leg, Left (6a. )   +: Some effort against gravity Motor Leg, Right (6b. )   +: No effort against gravity Limb Ataxia (7. ): Present in one limb Sensory (8. )   +: Severe to total sensory loss, patient is not aware of being touched in the face, arm, and leg Best Language (9. )   +: Severe aphasia Dysarthria (10. ): Severe dysarthria, patient's speech is so slurred as to be unintelligible in the absence of or out of proportion to any dysphasia, or is mute/anarthric Extinction/Inattention (11.)   +: Profound hemi-inattention or extinction to more than one modality Modified SS Total  +: 24 Complete NIHSS TOTAL: 22 Last date known well: 12/11/19 Last time known well: 1400 Neuro Assessment:   Neuro Checks:   Initial (12/12/19 1222)  Last Documented NIHSS Modified Score: 24 (12/13/19 1508) Has TPA been given? No If patient is a Neuro Trauma and patient is going to OR before floor call report to 4N Charge nurse: [phone] or [phone]     R Recommendations: See Admitting Provider Note  Report given  to:   Additional Notes:

## 2019-12-13 NOTE — ED Notes (Signed)
Pt incontinent of urine; bedding changed, new brief applied

## 2019-12-13 NOTE — Progress Notes (Signed)
STROKE TEAM PROGRESS NOTE   INTERVAL HISTORY No one is at the bedside. Pt remains in the  ED awaiting bed.  She remains globally aphasic with right dense hemiplegia and is not following any commands.  Vitals:   12/13/19 0545 12/13/19 0747 12/13/19 0845 12/13/19 0849  BP: 139/61 136/69 128/68   Pulse: (!) 101 (!) 101 100 99  Resp:  (!) 24 (!) 27 20  Temp:      TempSrc:      SpO2: 100% 100% 100% 100%    CBC:  Recent Labs  Lab 12/12/19 1145 12/12/19 1154  WBC 11.6*  --   NEUTROABS 10.3*  --   HGB 11.7* 12.2*  HCT 36.8* 36.0*  MCV 90.9  --   PLT 193  --     Basic Metabolic Panel:  Recent Labs  Lab 12/12/19 1145 12/12/19 1154  NA 137 138  K 4.3 4.2  CL 105 106  CO2 21*  --   GLUCOSE 189* 181*  BUN 22 22  CREATININE 1.56* 1.50*  CALCIUM 9.2  --    Lipid Panel:     Component Value Date/Time   CHOL 166 12/13/2019 0444   TRIG 105 12/13/2019 0444   HDL 51 12/13/2019 0444   CHOLHDL 3.3 12/13/2019 0444   VLDL 21 12/13/2019 0444   LDLCALC 94 12/13/2019 0444   HgbA1c: No results found for: HGBA1C Urine Drug Screen: No results found for: LABOPIA, COCAINSCRNUR, LABBENZ, AMPHETMU, THCU, LABBARB  Alcohol Level No results found for: ETH  IMAGING past 48 hours CT Code Stroke CTA Head W/WO contrast  Result Date: 12/12/2019 CLINICAL DATA:  Code stroke.  Right-sided weakness. EXAM: CT ANGIOGRAPHY HEAD AND NECK CT PERFUSION BRAIN TECHNIQUE: Multidetector CT imaging of the head and neck was performed using the standard protocol during bolus administration of intravenous contrast. Multiplanar CT image reconstructions and MIPs were obtained to evaluate the vascular anatomy. Carotid stenosis measurements (when applicable) are obtained utilizing NASCET criteria, using the distal internal carotid diameter as the denominator. Multiphase CT imaging of the brain was performed following IV bolus contrast injection. Subsequent parametric perfusion maps were calculated using RAPID software.  CONTRAST:  Administered intravenous contrast not known at this time. COMPARISON:  Noncontrast head CT performed concurrently. FINDINGS: CTA NECK FINDINGS Aortic arch: Standard aortic branching. Soft and calcified plaque within the visualized aortic arch and proximal major branch vessels of the neck. Right carotid system: CCA and ICA patent within the neck without significant stenosis. Minimal calcified plaque at the carotid bifurcation. Partially retropharyngeal course of the ICA. Left carotid system: CCA and ICA patent within the neck without significant stenosis. Minimal soft plaque within the CCA. Minimal mixed plaque at the carotid bifurcation. Vertebral arteries: Codominant. Calcified plaque at the bilateral vertebral artery origins without ostial stenosis. The right vertebral artery is patent throughout the neck with no significant atherosclerotic narrowing. There is moderate narrowing of the right vertebral artery at the C3-C4 level due to prominent uncinate and facet hypertrophy (series 7, image 202). The left vertebral artery is patent throughout the neck without significant stenosis. Skeleton: No acute bony abnormality. Cervical spondylosis with multilevel posterior disc osteophytes, uncovertebral and facet hypertrophy. There are also prominent multilevel ventral osteophytes. Other neck: No neck mass or cervical lymphadenopathy. Upper chest: No consolidation within the imaged lung apices. Review of the MIP images confirms the above findings CTA HEAD FINDINGS Anterior circulation: The bilateral intracranial internal carotid arteries are patent with scattered calcified plaque but no significant stenosis. The  right middle and anterior cerebral arteries are patent without proximal branch occlusion. 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 The left anterior cerebral artery is patent without proximal branch occlusion. Posterior  circulation: The intracranial vertebral arteries are patent bilaterally. Mild atherosclerotic narrowing of the intracranial left vertebral artery. The basilar artery is patent without significant stenosis. Bilateral posterior cerebral arteries are patent without proximal branch occlusion. High-grade stenosis within the P3 segment on the right Venous sinuses: Within limitations of contrast timing, no convincing thrombus. Anatomic variants: Posterior communicating arteries are poorly delineated and may be hypoplastic or absent bilaterally. Review of the MIP images confirms the above findings CT Brain Perfusion Findings: ASPECTS: 10 CBF (<30%) Volume: 80mL Perfusion (Tmax>6.0s) volume: Mismatch Volume: 80 mL Infarction Location:Left MCA vascular territory. These results were called by telephone at the time of interpretation on 12/12/2019 at 12:15 pm to provider Dr. Wilford Corner, who verbally acknowledged these results. IMPRESSION: CTA neck: 1. The bilateral common and internal carotid arteries are patent without significant stenosis. Mild atherosclerotic plaque within the bilateral carotid systems, as described. 2. Moderate narrowing of the right vertebral artery at the C3-C4 level due to uncinate/facet hypertrophy. No significant atherosclerotic narrowing of the vertebral arteries within the neck. CTA head: 1. 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 2. No other proximal large vessel occlusion identified. 3. High-grade focal stenosis within the P3 right PCA of uncertain chronicity. CT perfusion head: 1. Significant motion degradation may limit reliability of the perfusion data. 2. The perfusion software identifies an 88 mL core infarct in the left MCA territory. The perfusion software identifies a 168 mL region of critically hypoperfused parenchyma in the left MCA territory. Reported mismatch volume 80 mL. Reported mismatch ratio 1.9. This suggest a much  larger core infarct than is identifiable on the noncontrast head CT. Electronically Signed   By: Jackey Loge DO   On: 12/12/2019 12:28   CT Code Stroke CTA Neck W/WO contrast  Result Date: 12/12/2019 CLINICAL DATA:  Code stroke.  Right-sided weakness. EXAM: CT ANGIOGRAPHY HEAD AND NECK CT PERFUSION BRAIN TECHNIQUE: Multidetector CT imaging of the head and neck was performed using the standard protocol during bolus administration of intravenous contrast. Multiplanar CT image reconstructions and MIPs were obtained to evaluate the vascular anatomy. Carotid stenosis measurements (when applicable) are obtained utilizing NASCET criteria, using the distal internal carotid diameter as the denominator. Multiphase CT imaging of the brain was performed following IV bolus contrast injection. Subsequent parametric perfusion maps were calculated using RAPID software. CONTRAST:  Administered intravenous contrast not known at this time. COMPARISON:  Noncontrast head CT performed concurrently. FINDINGS: CTA NECK FINDINGS Aortic arch: Standard aortic branching. Soft and calcified plaque within the visualized aortic arch and proximal major branch vessels of the neck. Right carotid system: CCA and ICA patent within the neck without significant stenosis. Minimal calcified plaque at the carotid bifurcation. Partially retropharyngeal course of the ICA. Left carotid system: CCA and ICA patent within the neck without significant stenosis. Minimal soft plaque within the CCA. Minimal mixed plaque at the carotid bifurcation. Vertebral arteries: Codominant. Calcified plaque at the bilateral vertebral artery origins without ostial stenosis. The right vertebral artery is patent throughout the neck with no significant atherosclerotic narrowing. There is moderate narrowing of the right vertebral artery at the C3-C4 level due to prominent uncinate and facet hypertrophy (series 7, image 202). The left vertebral artery is  patent throughout the  neck without significant stenosis. Skeleton: No acute bony abnormality. Cervical spondylosis with multilevel posterior disc osteophytes, uncovertebral and facet hypertrophy. There are also prominent multilevel ventral osteophytes. Other neck: No neck mass or cervical lymphadenopathy. Upper chest: No consolidation within the imaged lung apices. Review of the MIP images confirms the above findings CTA HEAD FINDINGS Anterior circulation: The bilateral intracranial internal carotid arteries are patent with scattered calcified plaque but no significant stenosis. The right middle and anterior cerebral arteries are patent without proximal branch occlusion. 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 The left anterior cerebral artery is patent without proximal branch occlusion. Posterior circulation: The intracranial vertebral arteries are patent bilaterally. Mild atherosclerotic narrowing of the intracranial left vertebral artery. The basilar artery is patent without significant stenosis. Bilateral posterior cerebral arteries are patent without proximal branch occlusion. High-grade stenosis within the P3 segment on the right Venous sinuses: Within limitations of contrast timing, no convincing thrombus. Anatomic variants: Posterior communicating arteries are poorly delineated and may be hypoplastic or absent bilaterally. Review of the MIP images confirms the above findings CT Brain Perfusion Findings: ASPECTS: 10 CBF (<30%) Volume: 88mL Perfusion (Tmax>6.0s) volume: Mismatch Volume: 80 mL Infarction Location:Left MCA vascular territory. These results were called by telephone at the time of interpretation on 12/12/2019 at 12:15 pm to provider Dr. Wilford Corner, who verbally acknowledged these results. IMPRESSION: CTA neck: 1. The bilateral common and internal carotid arteries are patent without significant stenosis. Mild atherosclerotic plaque within  the bilateral carotid systems, as described. 2. Moderate narrowing of the right vertebral artery at the C3-C4 level due to uncinate/facet hypertrophy. No significant atherosclerotic narrowing of the vertebral arteries within the neck. CTA head: 1. 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 2. No other proximal large vessel occlusion identified. 3. High-grade focal stenosis within the P3 right PCA of uncertain chronicity. CT perfusion head: 1. Significant motion degradation may limit reliability of the perfusion data. 2. The perfusion software identifies an 88 mL core infarct in the left MCA territory. The perfusion software identifies a 168 mL region of critically hypoperfused parenchyma in the left MCA territory. Reported mismatch volume 80 mL. Reported mismatch ratio 1.9. This suggest a much larger core infarct than is identifiable on the noncontrast head CT. Electronically Signed   By: Jackey Loge DO   On: 12/12/2019 12:28   MR BRAIN WO CONTRAST  Result Date: 12/12/2019 CLINICAL DATA:  Neuro deficit, acute, stroke suspected. EXAM: MRI HEAD WITHOUT CONTRAST TECHNIQUE: Multiplanar, multiecho pulse sequences of the brain and surrounding structures were obtained without intravenous contrast. COMPARISON:  Noncontrast head CT and CT angiogram head/neck as well as CT perfusion performed earlier the same day. FINDINGS: Brain: A limited protocol utilizing only axial and coronal diffusion-weighted imaging, as well as axial T2 FLAIR imaging is performed. The axial T2 FLAIR sequence is significantly motion degraded, limiting evaluation. There is a moderate to large region of cortical/subcortical restricted diffusion within the left MCA vascular territory involving the left temporal lobe, left insula and left frontal operculum. To a lesser degree there is acute infarction change within the left parietal cortex. There is patchy, mild corresponding T2/FLAIR  hyperintensity. There are multiple additional punctate cortically based infarcts within the high left frontal lobe, right frontal lobe and bilateral parietooccipital lobes. A small acute infarct is also questioned within the right basal ganglia. There are also several small acute  infarcts within the cerebellar hemispheres. Chronic right temporal occipital lobe cortically based infarct. Background of moderate chronic small vessel ischemic disease with chronic lacunar infarcts within bilateral basal ganglia and thalami. Vascular: Poorly assessed on the acquired sequences. Skull and upper cervical spine: Limited assessment for calvarial lesions on the acquired sequences. No marrow signal abnormality identified. Sinuses/Orbits: Limited assessment of the orbits on the acquired sequences. Partial opacification of the left maxillary sinus. These results were called by telephone at the time of interpretation on 12/12/2019 at 12:59 pm to provider [facility] , who verbally acknowledged these results. IMPRESSION: Limited protocol, motion degraded examination. Moderate to large acute infarct within the left MCA vascular territory, as detailed. Multiple small additional acute infarcts within the bilateral frontal and parietooccipital lobes, right basal ganglia and cerebellum. Correlate for an embolic phenomenon. Chronic right temporal occipital cortically based infarct. Background of moderate chronic small vessel ischemic disease. Chronic lacunar infarcts within the bilateral basal ganglia and thalami. Electronically Signed   By: Jackey Loge DO   On: 12/12/2019 12:58   CT C-SPINE NO CHARGE  Result Date: 12/12/2019 CLINICAL DATA:  Code stroke.  Patient found down. EXAM: CT CERVICAL SPINE WITHOUT CONTRAST TECHNIQUE: Multidetector CT imaging of the cervical spine was performed without intravenous contrast. Multiplanar CT image reconstructions were also generated. COMPARISON:  Same day noncontrast CT head as well as CT angiogram  head/neck and CT perfusion head. FINDINGS: Alignment: Cervical levocurvature with incompletely imaged thoracic dextrocurvature. No significant spondylolisthesis. Leftward rotation of C1 upon C2 may be positional at the time of examination. Skull base and vertebrae: The basion-dental and atlanto-dental intervals are maintained.No evidence of acute fracture to the cervical spine. Soft tissues and spinal canal: No prevertebral fluid or swelling. No visible canal hematoma. Disc levels: Cervical spondylosis with multilevel disc height loss, posterior disc osteophytes, uncovertebral and facet hypertrophy. There also prominent multilevel ventral osteophytes within the cervical and visualized upper thoracic spine. Upper chest: No consolidation within the imaged lung apices. No visible pneumothorax. Mild interstitial prominence within the medial left lung apex is nonspecific. IMPRESSION: 1. No evidence of acute fracture to the cervical spine. 2. Leftward rotation of C1 upon C2 may be positional at the time of examination. Clinical correlation is recommended. 3. Cervical spondylosis as described. 4. Nonspecific interstitial prominence within the partially imaged medial left lung apex. Chest radiographs are recommended for further evaluation. Electronically Signed   By: Jackey Loge DO   On: 12/12/2019 13:38   CT Code Stroke Cerebral Perfusion with contrast  Result Date: 12/12/2019 CLINICAL DATA:  Code stroke.  Right-sided weakness. EXAM: CT ANGIOGRAPHY HEAD AND NECK CT PERFUSION BRAIN TECHNIQUE: Multidetector CT imaging of the head and neck was performed using the standard protocol during bolus administration of intravenous contrast. Multiplanar CT image reconstructions and MIPs were obtained to evaluate the vascular anatomy. Carotid stenosis measurements (when applicable) are obtained utilizing NASCET criteria, using the distal internal carotid diameter as the denominator. Multiphase CT imaging of the brain was performed  following IV bolus contrast injection. Subsequent parametric perfusion maps were calculated using RAPID software. CONTRAST:  Administered intravenous contrast not known at this time. COMPARISON:  Noncontrast head CT performed concurrently. FINDINGS: CTA NECK FINDINGS Aortic arch: Standard aortic branching. Soft and calcified plaque within the visualized aortic arch and proximal major branch vessels of the neck. Right carotid system: CCA and ICA patent within the neck without significant stenosis. Minimal calcified plaque at the carotid bifurcation. Partially retropharyngeal course of the ICA. Left carotid  system: CCA and ICA patent within the neck without significant stenosis. Minimal soft plaque within the CCA. Minimal mixed plaque at the carotid bifurcation. Vertebral arteries: Codominant. Calcified plaque at the bilateral vertebral artery origins without ostial stenosis. The right vertebral artery is patent throughout the neck with no significant atherosclerotic narrowing. There is moderate narrowing of the right vertebral artery at the C3-C4 level due to prominent uncinate and facet hypertrophy (series 7, image 202). The left vertebral artery is patent throughout the neck without significant stenosis. Skeleton: No acute bony abnormality. Cervical spondylosis with multilevel posterior disc osteophytes, uncovertebral and facet hypertrophy. There are also prominent multilevel ventral osteophytes. Other neck: No neck mass or cervical lymphadenopathy. Upper chest: No consolidation within the imaged lung apices. Review of the MIP images confirms the above findings CTA HEAD FINDINGS Anterior circulation: The bilateral intracranial internal carotid arteries are patent with scattered calcified plaque but no significant stenosis. The right middle and anterior cerebral arteries are patent without proximal branch occlusion. There is abrupt occlusion of the proximal M1 left middle cerebral artery without significant  reconstitution more distally. The left anterior cerebral artery is patent without proximal branch occlusion. Posterior circulation: The intracranial vertebral arteries are patent bilaterally. Mild atherosclerotic narrowing of the intracranial left vertebral artery. The basilar artery is patent without significant stenosis. Bilateral posterior cerebral arteries are patent without proximal branch occlusion. High-grade stenosis within the P3 segment on the right Venous sinuses: Within limitations of contrast timing, no convincing thrombus. Anatomic variants: Posterior communicating arteries are poorly delineated and may be hypoplastic or absent bilaterally. Review of the MIP images confirms the above findings CT Brain Perfusion Findings: ASPECTS: 10 CBF (<30%) Volume: 56mL Perfusion (Tmax>6.0s) volume: 159mL Mismatch Volume: 80 mL Infarction Location:Left MCA vascular territory. These results were called by telephone at the time of interpretation on 12/12/2019 at 12:15 pm to provider Dr. Rory Percy, who verbally acknowledged these results. IMPRESSION: CTA neck: 1. The bilateral common and internal carotid arteries are patent without significant stenosis. Mild atherosclerotic plaque within the bilateral carotid systems, as described. 2. Moderate narrowing of the right vertebral artery at the C3-C4 level due to uncinate/facet hypertrophy. No significant atherosclerotic narrowing of the vertebral arteries within the neck. CTA head: 1. 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 2. No other proximal large vessel occlusion identified. 3. High-grade focal stenosis within the P3 right PCA of uncertain chronicity. CT perfusion head: 1. Significant motion degradation may limit reliability of the perfusion data. 2. The perfusion software identifies an 88 mL core infarct in the left MCA territory. The perfusion software identifies a 168 mL region of critically hypoperfused  parenchyma in the left MCA territory. Reported mismatch volume 80 mL. Reported mismatch ratio 1.9. This suggest a much larger core infarct than is identifiable on the noncontrast head CT. Electronically Signed   By: Kellie Simmering DO   On: 12/12/2019 12:28   CT HEAD CODE STROKE WO CONTRAST  Result Date: 12/12/2019 CLINICAL DATA:  Code stroke. Neuro deficit, acute, stroke suspected. Sudden onset right-sided weakness, aphasia, left gaze EXAM: CT HEAD WITHOUT CONTRAST TECHNIQUE: Contiguous axial images were obtained from the base of the skull through the vertex without intravenous contrast. COMPARISON:  No pertinent prior studies available for comparison. FINDINGS: Brain: No evidence of acute intracranial hemorrhage. No acute demarcated cortical infarct is identified. Chronic appearing right temporal occipital lobe cortically based infarct. Ill-defined hypoattenuation within the cerebral white matter is nonspecific, but consistent with chronic  small vessel ischemic disease. Age-indeterminate lacunar infarcts within the right basal ganglia and left thalamus. Mild generalized parenchymal atrophy. No evidence of intracranial mass. No midline shift. Probable thin right frontoparietal hygroma or chronic subdural hematoma measuring up to 5 mm (series 5, image 28). Vascular: A dense M1 left MCA is questioned (series 3, image 17). Atherosclerotic calcifications. Skull: Normal. Negative for fracture or focal lesion. Sinuses/Orbits: Visualized orbits demonstrate no acute abnormality. Partial opacification of the left maxillary sinus. Mucous retention cyst within the inferior right maxillary sinus. No significant mastoid effusion. These results were called by telephone at the time of interpretation on 12/12/2019 at 12:06 pm to provider Dr. Wilford CornerArora, who verbally acknowledged these results. IMPRESSION: No acute intracranial hemorrhage or acute demarcated cortical infarction identified. Suspicion for hyperdense M1 left MCA. ASPECTS 10  for left MCA vascular territory. Age-indeterminate lacunar infarcts within the right basal ganglia and left thalamus. 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 Chronic appearing cortically based infarct within the right temporal occipital lobes. Background generalized parenchymal atrophy and chronic small vessel ischemic disease. Electronically Signed   By: Jackey LogeKyle  Golden DO   On: 12/12/2019 12:09    PHYSICAL EXAM Elderly Caucasian male who is not in distress.  Is unresponsive. . Afebrile. Head is nontraumatic. Neck is supple without bruit.    Cardiac exam no murmur or gallop. Lungs are clear to auscultation. Distal pulses are well felt. Neurological Exam :  Patient is awake but globally aphasic and not speaking or following any commands.  Right eye shows corneal opacity with decreased size.  Left gaze preference.  Right lower facial weakness.  Tongue midline.  Purposeful antigravity movements in the left upper and lower extremity.  Right hemiplegia with trace withdrawal to noxious stimuli in the upper and lower extremity on the right.  Tone is diminished on the right.  Right plantar upgoing.  Left downgoing.  ASSESSMENT/PLAN Mr. Ralene BatheCurtis Schmidt is a 84 y.o. male with history of HTN and HLD presenting with side paralysis and left gaze.   Stroke:  Large L MCA and bilateral anterior and posterior circulation scattered infarcts embolic secondary to unknown source  Code Stroke CT head No acute abnormality. Suspicious for hyperdense L M1. Age indeterminate R basal ganglia and L thalamic lacunes. Probable thin frontoparietal hygroma or chronic SDH up to 5mm. Old R temporal occipital lobe infarct. Small vessel disease. Atrophy.   CTA head L M1 occlusion, R P3 stenosis   CTA neck Unremarkable   CT perfusion 88 mL core L MCA, 168 mL penumbra L MCA  MRI  Moderate to large L MCA territory infarct with additional B frontal lobe and parietal occipital lobe, R  basal ganglia and R cerebellar infarcts. Old R temporal occipital lobe infarct.  2D Echo EF 60-65%. No source of embolus   LDL 94  HgbA1c No results found for requested labs within last [id number] hours.  SCDs for VTE prophylaxis  No antithrombotic prior to admission, now on aspirin 300 mg suppository daily.   Therapy recommendations:  pending   Disposition:  pending (rents room, does not drive or cook)  Recommend palliative care consult/comfort care  DNR at baseline  Hypertension  Stable . Permissive hypertension (OK if < 220/120) but gradually normalize in 5-7 days . Long-term BP goal normotensive  Hyperlipidemia  Home meds:  lipitor 20  Resume statin once po access, depending on plan of care (no high intensity statin given advanced age)  LDL 94, goal <  70  Continue statin at discharge  Dysphagia . Secondary to stroke . NPO . Speech on board   Other Stroke Risk Factors  Advanced age  Other Active Problems  CKD Stage IIIa  Normocytic anemia  FTT  Hospital day # 1 I have personally obtained history,examined this patient, reviewed notes, independently viewed imaging studies, participated in medical decision making and plan of care.ROS completed by me personally and pertinent positives fully documented  I have made any additions or clarifications directly to the above note.  Patient has presented with a large left MCA infarct with global aphasia and dense right hemiplegia and presented beyond time window for intervention.  His prognosis for making significant recovery and independent survival is slim.  Family has chosen DNR and likely needing towards palliative care approach hence do not recommend aggressive care.  Discussed with Dr. Waymon Amato medical hospitalist who agrees with the plan. I tried to reach patient's power of attorney but was unable to do so over the phone.  Greater than 50% time during this 35-minute visit was spent on counseling and coordination of care  and discussion with care team and answering questions. Delia Heady, MD Medical Director [facility] Pager: [phone] 12/13/2019 5:37 PM   To contact Stroke Continuity provider, please refer to [email or web address]. After hours, contact General Neurology

## 2019-12-13 NOTE — ED Notes (Signed)
Attempted report 

## 2019-12-13 NOTE — Progress Notes (Signed)
  Echocardiogram 2D Echocardiogram has been performed with Definity.  Gerda Diss 12/13/2019, 11:01 AM

## 2019-12-13 NOTE — Progress Notes (Signed)
PROGRESS NOTE   Bernard Schmidt  IEP:[id number]    DOB: [birth year]    DOA: 12/12/2019  PCP: Heywood Bene, PA-C   I have briefly reviewed patients previous medical records in [facility].  Chief Complaint:   Chief Complaint  Patient presents with  . Code Stroke    Brief Narrative:  84 year old male from home, lives alone, PMH of hypertension and hyperlipidemia, last known well 1/2, found lying on the floor on morning of admission admitted for acute large MCA stroke and acute encephalopathy.  Neurology consulted.  Poor prognosis.  PMT consulted for goals of care.   Assessment & Plan:  Principal Problem:   Acute ischemic left MCA stroke (HCC) Active Problems:   Essential hypertension   Dyslipidemia   DNR (do not resuscitate)   Acute left MCA stroke, large  With associated right hemiparesis, aphasia, suspected dysphagia and acute encephalopathy.  MRI brain: Moderate to large acute infarct within the left MCA vascular territory.  Multiple small additional acute infarcts within the bilateral frontal and parieto-occipital lobes, right basal ganglia and cerebellum.  Concern for embolic phenomenon.  Chronic right temporal occipital infarct.  CTA head: Abrupt occlusion of the proximal M1 left MCA without significant distal reconstitution.  High-grade focal stenosis within P3 right PCA.  CTA neck: Bilateral common and internal carotid arteries patent.  No significant narrowing of vertebral arteries in the neck.  CT perfusion head 88 mL core infarct in left MCA territory.  168 mL region of critically hypoperfused parenchyma in the left MCA territory.  TTE: LVEF 60-65%.  Mild LVH.  Grade 1 diastolic dysfunction.  LDL 94.  A1c pending.  Rectal aspirin.  Await therapies evaluation but unlikely to participate with current mental status changes.  Suspect significant ongoing deficits related to large acute stroke with overall poor prognosis, poor chance of recovery to good  quality of life.  Discussed with Dr. Leonie Man, Neurology who recommends palliative care consultation for goals of care, consulted.  Gentle IV fluids while n.p.o.  Essential hypertension  Allow permissive hypertension and treat only if BP >220/120.  Hyperlipidemia  LDL 94, goal <70.  Lipitor when and if able to tolerate p.o.  Stage IIIa chronic kidney disease  Creatinine appears to be at baseline 1.5 compared to 2011.  Normocytic anemia  Suspect chronic disease  Adult failure to thrive  Secondary to acute stroke.  As per admitting MDs discussion with patient's landlord who also accesses POA, if patient did not show significant improvement in regaining function, he would prefer to transition to comfort measures, no feeding tube and is DNR.   DVT prophylaxis: SCDs Code Status: DNR Family Communication: None at bedside Disposition: To be determined   Consultants:   Neurology PMT  Procedures:   None   Antimicrobials:   As above   Subjective:  Patient nonverbal.  Does not track activity with his eye either.  Does not follow instructions.  As per RN, no acute issues noted.  Objective:   Vitals:   12/13/19 1030 12/13/19 1100 12/13/19 1215 12/13/19 1300  BP: 136/69 (!) 143/65 130/69 139/77  Pulse: (!) 101 95 94 98  Resp: (!) 24 (!) 25 16 (!) 22  Temp:      TempSrc:      SpO2: 99% 100% 100% 98%    General exam: Elderly male, moderately built and nourished lying comfortably propped up in bed without distress. Respiratory system: Clear to auscultation/poor inspiratory effort. Respiratory effort normal. Cardiovascular system: S1 & S2 heard,  RRR. No JVD, murmurs, rubs, gallops or clicks. No pedal edema.  Telemetry: Sinus tachycardia in the 100s. Gastrointestinal system: Abdomen is nondistended, soft and nontender. No organomegaly or masses felt. Normal bowel sounds heard. Central nervous system: Slightly drowsy but easily arousable, left eye open, enucleated (chronic)  right eye-phthisis bulbi.  Aphasic.  No facial asymmetry.  Does not appear to be able to protect secretions. Extremities: Purposeful movement of left upper extremity, touches his chest.  Mild tremors of left upper extremity. Skin: No rashes, lesions or ulcers Psychiatry: Judgement and insight impaired. Mood & affect cannot be assessed.     Data Reviewed:   I have personally reviewed following labs and imaging studies   CBC: Recent Labs  Lab 12/12/19 1145 12/12/19 1154  WBC 11.6*  --   NEUTROABS 10.3*  --   HGB 11.7* 12.2*  HCT 36.8* 36.0*  MCV 90.9  --   PLT 193  --     Basic Metabolic Panel: Recent Labs  Lab 12/12/19 1145 12/12/19 1154  NA 137 138  K 4.3 4.2  CL 105 106  CO2 21*  --   GLUCOSE 189* 181*  BUN 22 22  CREATININE 1.56* 1.50*  CALCIUM 9.2  --     Liver Function Tests: Recent Labs  Lab 12/12/19 1145  AST 21  ALT 13  ALKPHOS 151*  BILITOT 0.9  PROT 6.6  ALBUMIN 3.3*    CBG: Recent Labs  Lab 12/12/19 1140  GLUCAP 165*    Microbiology Studies:   Recent Results (from the past 240 hour(s))  Respiratory Panel by RT PCR (Flu A&B, Covid) - Nasopharyngeal Swab     Status: None   Collection Time: 12/12/19 12:08 PM   Specimen: Nasopharyngeal Swab  Result Value Ref Range Status   SARS Coronavirus 2 by RT PCR NEGATIVE NEGATIVE Final    Comment: (NOTE) SARS-CoV-2 target nucleic acids are NOT DETECTED. The SARS-CoV-2 RNA is generally detectable in upper respiratoy specimens during the acute phase of infection. The lowest concentration of SARS-CoV-2 viral copies this assay can detect is 131 copies/mL. A negative result does not preclude SARS-Cov-2 infection and should not be used as the sole basis for treatment or other patient management decisions. A negative result may occur with  improper specimen collection/handling, submission of specimen other than nasopharyngeal swab, presence of viral mutation(s) within the areas targeted by this assay,  and inadequate number of viral copies (<131 copies/mL). A negative result must be combined with clinical observations, patient history, and epidemiological information. The expected result is Negative. Fact Sheet for Patients:  https://www.moore.com/ Fact Sheet for Healthcare Providers:  https://www.young.biz/ This test is not yet ap proved or cleared by the [place] FDA and  has been authorized for detection and/or diagnosis of SARS-CoV-2 by FDA under an Emergency Use Authorization (EUA). This EUA will remain  in effect (meaning this test can be used) for the duration of the COVID-19 declaration under Section 564(b)(1) of the Act, 21 U.S.C. section 360bbb-3(b)(1), unless the authorization is terminated or revoked sooner.    Influenza A by PCR NEGATIVE NEGATIVE Final   Influenza B by PCR NEGATIVE NEGATIVE Final    Comment: (NOTE) The Xpert Xpress SARS-CoV-2/FLU/RSV assay is intended as an aid in  the diagnosis of influenza from Nasopharyngeal swab specimens and  should not be used as a sole basis for treatment. Nasal washings and  aspirates are unacceptable for Xpert Xpress SARS-CoV-2/FLU/RSV  testing. Fact Sheet for Patients: https://www.moore.com/ Fact Sheet for Healthcare  Providers: https://www.young.biz/ This test is not yet approved or cleared by the [place] and  has been authorized for detection and/or diagnosis of SARS-CoV-2 by  FDA under an Emergency Use Authorization (EUA). This EUA will remain  in effect (meaning this test can be used) for the duration of the  Covid-19 declaration under Section 564(b)(1) of the Act, 21  U.S.C. section 360bbb-3(b)(1), unless the authorization is  terminated or revoked. Performed at [facility], [address] [place], [place] [address]      Radiology Studies:  ECHOCARDIOGRAM COMPLETE  Result Date: 12/13/2019   ECHOCARDIOGRAM REPORT    Patient Name:   Bernard Schmidt Date of Exam: 12/13/2019 Medical Rec #:  [id number]       Height: Accession #:    [id number]      Weight: Date of Birth:  03-29-1936       BSA: Patient Age:    83 years        BP:           146/71 mmHg Patient Gender: M               HR:           105 bpm. Exam Location:  Inpatient Procedure: 2D Echo and Intracardiac Opacification Agent Indications:    Stroke 434.91/I163.9  History:        Patient has no prior history of Echocardiogram examinations.                 Risk Factors:Hypertension and Dyslipidemia.  Sonographer:    Ross Ludwig RDCS (AE) Referring Phys: [id number] JENNIFER YATES  Sonographer Comments: Suboptimal apical window and no subcostal window. Patient unable to respond to verbal requests. IMPRESSIONS  1. Left ventricular ejection fraction, by visual estimation, is 60 to 65%. The left ventricle has normal function. There is mildly increased left ventricular hypertrophy.  2. Definity contrast agent was given IV to delineate the left ventricular endocardial borders.  3. Left ventricular diastolic parameters are consistent with Grade I diastolic dysfunction (impaired relaxation).  4. The left ventricle has no regional wall motion abnormalities.  5. Global right ventricle has normal systolic function.The right ventricular size is normal. No increase in right ventricular wall thickness.  6. Left atrial size was normal.  7. Right atrial size was normal.  8. The mitral valve is normal in structure. No evidence of mitral valve regurgitation.  9. The tricuspid valve is normal in structure. 10. The aortic valve is normal in structure. Aortic valve regurgitation is not visualized. 11. The pulmonic valve was normal in structure. Pulmonic valve regurgitation is not visualized. 12. The atrial septum is grossly normal. FINDINGS  Left Ventricle: Left ventricular ejection fraction, by visual estimation, is 60 to 65%. The left ventricle has normal function. Definity contrast agent was given IV to  delineate the left ventricular endocardial borders. The left ventricle has no regional wall motion abnormalities. There is mildly increased left ventricular hypertrophy. Left ventricular diastolic parameters are consistent with Grade I diastolic dysfunction (impaired relaxation). Right Ventricle: The right ventricular size is normal. No increase in right ventricular wall thickness. Global RV systolic function is has normal systolic function. Left Atrium: Left atrial size was normal in size. Right Atrium: Right atrial size was normal in size Pericardium: There is no evidence of pericardial effusion. Mitral Valve: The mitral valve is normal in structure. No evidence of mitral valve regurgitation. Tricuspid Valve: The tricuspid valve is normal in structure. Tricuspid valve regurgitation  is not demonstrated. Aortic Valve: The aortic valve is normal in structure. Aortic valve regurgitation is not visualized. Aortic regurgitation PHT measures 346 msec. Aortic valve mean gradient measures 3.0 mmHg. Aortic valve peak gradient measures 6.2 mmHg. Aortic valve area, by VTI measures 3.53 cm. Pulmonic Valve: The pulmonic valve was normal in structure. Pulmonic valve regurgitation is not visualized. Pulmonic regurgitation is not visualized. Aorta: The aortic root and ascending aorta are structurally normal, with no evidence of dilitation. IAS/Shunts: The atrial septum is grossly normal.  LEFT VENTRICLE PLAX 2D LVIDd:         3.80 cm LVIDs:         2.80 cm LV PW:         1.30 cm LV IVS:        1.30 cm LVOT diam:     2.20 cm LV SV:         32 ml LVOT Area:     3.80 cm  RIGHT VENTRICLE RV Basal diam:  3.00 cm RV S prime:     14.10 cm/s TAPSE (M-mode): 1.8 cm LEFT ATRIUM             RIGHT ATRIUM LA diam:        3.60 cm RA Area:     10.20 cm LA Vol (A2C):   36.4 ml RA Volume:   19.80 ml LA Vol (A4C):   34.7 ml LA Biplane Vol: 37.5 ml  AORTIC VALVE AV Area (Vmax):    3.32 cm AV Area (Vmean):   3.28 cm AV Area (VTI):     3.53 cm  AV Vmax:           124.00 cm/s AV Vmean:          77.600 cm/s AV VTI:            0.179 m AV Peak Grad:      6.2 mmHg AV Mean Grad:      3.0 mmHg LVOT Vmax:         108.24 cm/s LVOT Vmean:        67.000 cm/s LVOT VTI:          0.166 m LVOT/AV VTI ratio: 0.93 AI PHT:            346 msec  AORTA Ao Root diam: 2.90 cm  SHUNTS Systemic VTI:  0.17 m Systemic Diam: 2.20 cm  Kristeen Miss MD Electronically signed by Kristeen Miss MD Signature Date/Time: 12/13/2019/1:16:29 PM    Final      Scheduled Meds:   .  stroke: mapping our early stages of recovery book   Does not apply Once  . aspirin  300 mg Rectal Daily   Or  . aspirin  325 mg Oral Daily  . latanoprost  1 drop Left Eye QHS    Continuous Infusions:   . sodium chloride 50 mL/hr at 12/12/19 1836     LOS: 1 day     Marcellus Scott, MD, [place], [facility]    To contact the attending provider between 7A-7P or the covering provider during after hours 7P-7A, please log into the web site [email or web address] and access using universal E. Lopez password for that web site. If you do not have the password, please call the hospital operator.  12/13/2019, 1:34 PM

## 2019-12-14 LAB — GLUCOSE, CAPILLARY
Glucose-Capillary: 92 mg/dL (ref 70–99)
Glucose-Capillary: 85 mg/dL (ref 70–99)
Glucose-Capillary: 93 mg/dL (ref 70–99)
Glucose-Capillary: 95 mg/dL (ref 70–99)
Glucose-Capillary: 97 mg/dL (ref 70–99)

## 2019-12-14 MED ORDER — CHLORHEXIDINE GLUCONATE 0.12 % MT SOLN
15.0000 mL | Freq: Two times a day (BID) | OROMUCOSAL | Status: DC
  Administered 2019-12-14 – 2019-12-16 (×6): 15 mL via OROMUCOSAL
  Filled 2019-12-14 (×5): qty 15

## 2019-12-14 MED ORDER — ORAL CARE MOUTH RINSE
15.0000 mL | Freq: Two times a day (BID) | OROMUCOSAL | Status: DC
  Administered 2019-12-14 – 2019-12-16 (×6): 15 mL via OROMUCOSAL

## 2019-12-14 NOTE — Progress Notes (Signed)
OT Cancellation Note  Patient Details Name: Bernard Schmidt MRN: [id number] DOB: Dec 02, 1936   Cancelled Treatment:    Reason Eval/Treat Not Completed: Other (comment) Pt currently with poor prognosis and no command following or eye opening at this time. RN requested waiting until palliative care meeting with family as pt may transition to comfort care. Will return as time allows and pt is appropriate.   Diona Browner OTR/L Acute Rehabilitation Services Office: [phone]   Rebeca Alert 12/14/2019, 10:59 AM

## 2019-12-14 NOTE — Evaluation (Signed)
Clinical/Bedside Swallow Evaluation Patient Details  Name: Bernard Schmidt MRN: [id number] Date of Birth: August 09, 1936  Today's Date: 12/14/2019 Time: SLP Start Time (ACUTE ONLY): 0855 SLP Stop Time (ACUTE ONLY): 0907 SLP Time Calculation (min) (ACUTE ONLY): 12 min  Past Medical History:  Past Medical History:  Diagnosis Date  . HLD (hyperlipidemia)   . HTN (hypertension)    Past Surgical History: History reviewed. No pertinent surgical history. HPI:  Bernard Schmidt is a 84 y.o. male with medical history significant of HTN and HLD with right-sided weakness.  The patient is unable to provide history.  Per chart review, he lives in a rental home owned by a friend and she reports that she takes him to all of his appointments and makes all of his health care decisions and has necessary paperwork.  MRI on 1/3 was remarkable for "Moderate to large acute infarct within the left MCA vascular territory, as detailed.  Multiple small additional acute infarcts within the bilateral frontal and parietooccipital lobes, right basal ganglia and cerebellum. Correlate for an embolic phenomenon. Chronic right temporal occipital cortically based infarct."  No prior dysphagia or cognitive-linguistic impairment documented.     Assessment / Plan / Recommendation Clinical Impression  Pt presents with oral dysphagia and suspected pharyngeal dysphagia in the setting of an acute CVA.  Pt was encountered awake/alert, but he exhibited difficulty maintaining attention to SLP and to bolus trials.  He was unable to follow commands for oral mech exam secondary to global aphasia, but L facial droop and edentulism were observed.  Pt was seen with trials of ice chips, thin liquid, and puree.  Pt was unable to achieve labial closure around the spoon or straw during trials and they were therefore accepted passively into his oral cavity, placing him at a higher risk for aspiration.  Observed R anterior labial spillage with thin liquid  trials via assisted cup sip secondary to decreased labial closure.  Pt exhibited a delayed response to all boluses, but he exhibited consistent lingual manipulation (though reduced) and a suspected swallow initiation was observed with all trials.  Pt exhibited an immediate cough following 1/4 thin liquid trials via tsp, otherwise no overt s/sx of aspiration were observed with any trials.  Unable to evaluate pt's vocal quality secondary to aphasia and suspected apraxia of speech.  Due to elevated risk of aspiration, recommend continuation of NPO with frequent oral care and a few small ice chips or spoon sips of water PRN for comfort and to mobilize swallow musculature.  Pt may benefit from a palliative care consult to help determine goals of care.  SLP will f/u for diagnostic treatment per POC.    SLP Visit Diagnosis: Dysphagia, unspecified (R13.10)    Aspiration Risk  Mild aspiration risk;Moderate aspiration risk    Diet Recommendation NPO;Ice chips PRN after oral care   Liquid Administration via: Spoon Medication Administration: Via alternative means    Other  Recommendations Oral Care Recommendations: Oral care QID;Staff/trained caregiver to provide oral care;Oral care prior to ice chip/H20   Follow up Recommendations 24 hour supervision/assistance;Skilled Nursing facility      Frequency and Duration min 2x/week  2 weeks       Prognosis Prognosis for Safe Diet Advancement: Guarded Barriers to Reach Goals: Severity of deficits;Language deficits      Swallow Study   General Date of Onset: 12/12/19 HPI: Bernard Schmidt is a 84 y.o. male with medical history significant of HTN and HLD with right-sided weakness.  The patient is unable  to provide history.  Per chart review, he lives in a rental home owned by a friend and she reports that she takes him to all of his appointments and makes all of his health care decisions and has necessary paperwork.  MRI on 1/3 was remarkable for "Moderate to  large acute infarct within the left MCA vascular territory, as detailed.  Multiple small additional acute infarcts within the bilateral frontal and parietooccipital lobes, right basal ganglia and cerebellum. Correlate for an embolic phenomenon. Chronic right temporal occipital cortically based infarct."  No prior dysphagia or cognitive-linguistic impairment documented.   Type of Study: Bedside Swallow Evaluation Previous Swallow Assessment: None  Diet Prior to this Study: NPO Temperature Spikes Noted: Yes Respiratory Status: Room air History of Recent Intubation: No Behavior/Cognition: Alert;Doesn't follow directions Oral Cavity Assessment: Dry Oral Care Completed by SLP: No Oral Cavity - Dentition: Edentulous Self-Feeding Abilities: Total assist Patient Positioning: Upright in bed Baseline Vocal Quality: Not observed Volitional Cough: Cognitively unable to elicit Volitional Swallow: Unable to elicit    Oral/Motor/Sensory Function Overall Oral Motor/Sensory Function: Other (comment)(Unable to evaluate)   Ice Chips Ice chips: Impaired Presentation: Spoon Oral Phase Impairments: Reduced labial seal;Reduced lingual movement/coordination;Poor awareness of bolus Oral Phase Functional Implications: Prolonged oral transit;Oral holding Pharyngeal Phase Impairments: Suspected delayed Swallow   Thin Liquid Thin Liquid: Impaired Presentation: Cup;Spoon;Straw Oral Phase Impairments: Reduced labial seal;Reduced lingual movement/coordination Oral Phase Functional Implications: Prolonged oral transit;Oral residue;Right anterior spillage Pharyngeal  Phase Impairments: Cough - Immediate;Suspected delayed Swallow    Nectar Thick Nectar Thick Liquid: Not tested   Honey Thick Honey Thick Liquid: Not tested   Puree Puree: Impaired Presentation: Spoon Oral Phase Impairments: Reduced labial seal;Reduced lingual movement/coordination Oral Phase Functional Implications: Prolonged oral transit;Oral residue    Solid      Solid: Not tested     Villa Herb M.S., CCC-SLP Acute Rehabilitation Services Office: [phone]  Bernard Schmidt 12/14/2019,9:41 AM

## 2019-12-14 NOTE — Progress Notes (Signed)
STROKE TEAM PROGRESS NOTE   INTERVAL HISTORY No one is at the bedside. Pt  remains globally aphasic with right dense hemiplegia and is not following any commands.await family meeting today to discuss palliative care  Vitals:   12/14/19 0130 12/14/19 0329 12/14/19 0758 12/14/19 1150  BP: (!) 110/53 (!) 114/57 130/65 138/67  Pulse: 88 88 89 100  Resp: 18 18 20 16   Temp: 98.5 F (36.9 C) 98.2 F (36.8 C) 98.8 F (37.1 C) 98 F (36.7 C)  TempSrc: Oral Oral Oral Oral  SpO2: 96% 96% 99% 96%  Weight:        CBC:  Recent Labs  Lab 12/12/19 1145 12/12/19 1154  WBC 11.6*  --   NEUTROABS 10.3*  --   HGB 11.7* 12.2*  HCT 36.8* 36.0*  MCV 90.9  --   PLT 193  --     Basic Metabolic Panel:  Recent Labs  Lab 12/12/19 1145 12/12/19 1154  NA 137 138  K 4.3 4.2  CL 105 106  CO2 21*  --   GLUCOSE 189* 181*  BUN 22 22  CREATININE 1.56* 1.50*  CALCIUM 9.2  --    Lipid Panel:     Component Value Date/Time   CHOL 166 12/13/2019 0444   TRIG 105 12/13/2019 0444   HDL 51 12/13/2019 0444   CHOLHDL 3.3 12/13/2019 0444   VLDL 21 12/13/2019 0444   LDLCALC 94 12/13/2019 0444   HgbA1c: No results found for: HGBA1C Urine Drug Screen: No results found for: LABOPIA, COCAINSCRNUR, LABBENZ, AMPHETMU, THCU, LABBARB  Alcohol Level No results found for: [facility]  IMAGING past 48 hours ECHOCARDIOGRAM COMPLETE  Result Date: 12/13/2019   ECHOCARDIOGRAM REPORT   Patient Name:   SOMNANG MAHAN Date of Exam: 12/13/2019 Medical Rec #:  02/10/2020       Height: Accession #:    [id number]      Weight: Date of Birth:  17-Jul-1936       BSA: Patient Age:    84 years        BP:           146/71 mmHg Patient Gender: M               HR:           105 bpm. Exam Location:  Inpatient Procedure: 2D Echo and Intracardiac Opacification Agent Indications:    Stroke 434.91/I163.9  History:        Patient has no prior history of Echocardiogram examinations.                 Risk Factors:Hypertension and Dyslipidemia.   Sonographer:    01/09/1936 RDCS (AE) Referring Phys: [id number] JENNIFER YATES  Sonographer Comments: Suboptimal apical window and no subcostal window. Patient unable to respond to verbal requests. IMPRESSIONS  1. Left ventricular ejection fraction, by visual estimation, is 60 to 65%. The left ventricle has normal function. There is mildly increased left ventricular hypertrophy.  2. Definity contrast agent was given IV to delineate the left ventricular endocardial borders.  3. Left ventricular diastolic parameters are consistent with Grade I diastolic dysfunction (impaired relaxation).  4. The left ventricle has no regional wall motion abnormalities.  5. Global right ventricle has normal systolic function.The right ventricular size is normal. No increase in right ventricular wall thickness.  6. Left atrial size was normal.  7. Right atrial size was normal.  8. The mitral valve is normal in structure. No evidence of mitral valve regurgitation.  9. The tricuspid valve is normal in structure. 10. The aortic valve is normal in structure. Aortic valve regurgitation is not visualized. 11. The pulmonic valve was normal in structure. Pulmonic valve regurgitation is not visualized. 12. The atrial septum is grossly normal. FINDINGS  Left Ventricle: Left ventricular ejection fraction, by visual estimation, is 60 to 65%. The left ventricle has normal function. Definity contrast agent was given IV to delineate the left ventricular endocardial borders. The left ventricle has no regional wall motion abnormalities. There is mildly increased left ventricular hypertrophy. Left ventricular diastolic parameters are consistent with Grade I diastolic dysfunction (impaired relaxation). Right Ventricle: The right ventricular size is normal. No increase in right ventricular wall thickness. Global RV systolic function is has normal systolic function. Left Atrium: Left atrial size was normal in size. Right Atrium: Right atrial size was normal in  size Pericardium: There is no evidence of pericardial effusion. Mitral Valve: The mitral valve is normal in structure. No evidence of mitral valve regurgitation. Tricuspid Valve: The tricuspid valve is normal in structure. Tricuspid valve regurgitation is not demonstrated. Aortic Valve: The aortic valve is normal in structure. Aortic valve regurgitation is not visualized. Aortic regurgitation PHT measures 346 msec. Aortic valve mean gradient measures 3.0 mmHg. Aortic valve peak gradient measures 6.2 mmHg. Aortic valve area, by VTI measures 3.53 cm. Pulmonic Valve: The pulmonic valve was normal in structure. Pulmonic valve regurgitation is not visualized. Pulmonic regurgitation is not visualized. Aorta: The aortic root and ascending aorta are structurally normal, with no evidence of dilitation. IAS/Shunts: The atrial septum is grossly normal.  LEFT VENTRICLE PLAX 2D LVIDd:         3.80 cm LVIDs:         2.80 cm LV PW:         1.30 cm LV IVS:        1.30 cm LVOT diam:     2.20 cm LV SV:         32 ml LVOT Area:     3.80 cm  RIGHT VENTRICLE RV Basal diam:  3.00 cm RV S prime:     14.10 cm/s TAPSE (M-mode): 1.8 cm LEFT ATRIUM             RIGHT ATRIUM LA diam:        3.60 cm RA Area:     10.20 cm LA Vol (A2C):   36.4 ml RA Volume:   19.80 ml LA Vol (A4C):   34.7 ml LA Biplane Vol: 37.5 ml  AORTIC VALVE AV Area (Vmax):    3.32 cm AV Area (Vmean):   3.28 cm AV Area (VTI):     3.53 cm AV Vmax:           124.00 cm/s AV Vmean:          77.600 cm/s AV VTI:            0.179 m AV Peak Grad:      6.2 mmHg AV Mean Grad:      3.0 mmHg LVOT Vmax:         108.24 cm/s LVOT Vmean:        67.000 cm/s LVOT VTI:          0.166 m LVOT/AV VTI ratio: 0.93 AI PHT:            346 msec  AORTA Ao Root diam: 2.90 cm  SHUNTS Systemic VTI:  0.17 m Systemic Diam: 2.20 cm  Kristeen Miss MD Electronically signed  by Mertie Moores MD Signature Date/Time: 12/13/2019/1:16:29 PM    Final     PHYSICAL EXAM Elderly Caucasian male who is not in  distress.  Is unresponsive. . Afebrile. Head is nontraumatic. Neck is supple without bruit.    Cardiac exam no murmur or gallop. Lungs are clear to auscultation. Distal pulses are well felt. Neurological Exam :  Patient is awake but globally aphasic and not speaking or following any commands.  Right eye shows corneal opacity with decreased size.  Left gaze preference.  Right lower facial weakness.  Tongue midline.  Purposeful antigravity movements in the left upper and lower extremity.  Right hemiplegia with trace withdrawal to noxious stimuli in the upper and lower extremity on the right.  Tone is diminished on the right.  Right plantar upgoing.  Left downgoing.  ASSESSMENT/PLAN Mr. Amedio Bowlby is a 84 y.o. male with history of HTN and HLD presenting with side paralysis and left gaze.   Stroke:  Large L MCA and bilateral anterior and posterior circulation scattered infarcts embolic secondary to unknown source  Code Stroke CT head No acute abnormality. Suspicious for hyperdense L M1. Age indeterminate R basal ganglia and L thalamic lacunes. Probable thin frontoparietal hygroma or chronic SDH up to 61mm. Old R temporal occipital lobe infarct. Small vessel disease. Atrophy.   CTA head L M1 occlusion, R P3 stenosis   CTA neck Unremarkable   CT perfusion 88 mL core L MCA, 168 mL penumbra L MCA  MRI  Moderate to large L MCA territory infarct with additional B frontal lobe and parietal occipital lobe, R basal ganglia and R cerebellar infarcts. Old R temporal occipital lobe infarct.  2D Echo EF 60-65%. No source of embolus   LDL 94  HgbA1c No results found for requested labs within last 26280 hours.  SCDs for VTE prophylaxis  No antithrombotic prior to admission, now on aspirin 300 mg suppository daily.   Therapy recommendations:  pending   Disposition:  pending (rents room, does not drive or cook)  Recommend palliative care consult/comfort care  DNR at  baseline  Hypertension  Stable . Permissive hypertension (OK if < 220/120) but gradually normalize in 5-7 days . Long-term BP goal normotensive  Hyperlipidemia  Home meds:  lipitor 20  Resume statin once po access, depending on plan of care (no high intensity statin given advanced age)  LDL 94, goal < 70  Continue statin at discharge  Dysphagia . Secondary to stroke . NPO . Speech on board   Other Stroke Risk Factors  Advanced age  Other Active Problems  CKD Stage IIIa  Normocytic anemia  FTT  Hospital day # 2   Patient has presented with a large left MCA infarct with global aphasia and dense right hemiplegia and presented beyond time window for intervention.  His prognosis for making significant recovery and independent survival is slim.  Family has chosen DNR and likely needing towards palliative care approach hence do not recommend aggressive care.  Await family meeting today to discuss goals of care and palliative care approach   Antony Contras, MD Medical Director [place] Pager: [phone] 12/14/2019 1:35 PM   To contact Stroke Continuity provider, please refer to [email or web address] After hours, contact General Neurology

## 2019-12-14 NOTE — Progress Notes (Signed)
PT Cancellation Note  Patient Details Name: Bernard Schmidt MRN: [id number] DOB: 1936/06/05   Cancelled Treatment:    Reason Eval/Treat Not Completed: Other (comment). Awaiting palliative care meeting with family as patient with poor prognosis and with no command follow or eye opening at this time. There is a posibility patient may transition to comfort care. Acute PT to await for medical plan s/p palliative meeting prior to completing PT eval. Acute PT to return as able, if appropriate.  Lewis Shock, PT, DPT Acute Rehabilitation Services Pager #: [phone] Office #: [phone]    Iona Hansen 12/14/2019, 10:46 AM

## 2019-12-14 NOTE — Progress Notes (Addendum)
PROGRESS NOTE   Bernard Schmidt  HQP:[id number]    DOB: 05/27/1936    DOA: 12/12/2019  PCP: Roger Kill, PA-C   I have briefly reviewed patients [facility] in [facility].  Chief Complaint:   Chief Complaint  Patient presents with  . Code Stroke    Brief Narrative:  84 year old male from home, lives alone, PMH of hypertension and hyperlipidemia, last known well 1/2, found lying on the floor on morning of admission admitted for acute large MCA stroke and acute encephalopathy.  Neurology consulted.  Poor prognosis.  PMT consulted for goals of care and input pending.   Assessment & Plan:  Principal Problem:   Acute ischemic left MCA stroke (HCC) Active Problems:   Essential hypertension   Dyslipidemia   DNR (do not resuscitate)   Acute large left MCA stroke  With associated dense right hemiplegia, global aphasia, dysphagia and acute encephalopathy.  MRI brain: Moderate to large acute infarct within the left MCA vascular territory.  Multiple small additional acute infarcts within the bilateral frontal and parieto-occipital lobes, right basal ganglia and cerebellum.  Concern for embolic phenomenon.  Chronic right temporal occipital infarct.  CTA head: Abrupt occlusion of the proximal M1 left MCA without significant distal reconstitution.  High-grade focal stenosis within P3 right PCA.  CTA neck: Bilateral common and internal carotid arteries patent.  No significant narrowing of vertebral arteries in the neck.  CT perfusion head 88 mL core infarct in left MCA territory.  168 mL region of critically hypoperfused parenchyma in the left MCA territory.  TTE: LVEF 60-65%.  Mild LVH.  Grade 1 diastolic dysfunction.  LDL 94.  A1c pending.  Rectal aspirin.  Therapies unable to evaluate due to significant mental status changes and inability to participate.  Suspect significant ongoing deficits related to large acute stroke with overall poor prognosis, poor  chance of recovery to good quality of life.  Discussed with Dr. Pearlean Brownie, Neurology who recommends palliative care consultation for goals of care, consulted.  Gentle IV fluids while n.p.o.. Awaiting PMT input.  Essential hypertension  Allow permissive hypertension and treat only if BP >220/120.  Hyperlipidemia  LDL 94, goal <70.  Lipitor when and if able to tolerate p.o.  Stage IIIa chronic kidney disease  Creatinine appears to be at baseline 1.5 compared to 2011.  Normocytic anemia  Suspect chronic disease  Adult failure to thrive  Secondary to acute stroke.  As per admitting MDs discussion with patient's landlord who also acts as patient's POA, if patient did not show significant improvement in regaining function, he would prefer to transition to comfort measures, no feeding tube and is DNR.   DVT prophylaxis: SCDs Code Status: DNR Family Communication: I discussed in detail with patient's listed friend/healthcare power of attorney.  She is aware of patient's poor prognosis, would be unable to go back home because patient lives alone and she herself is 84 years old.  He will likely need residential hospice. Disposition: To be determined   Consultants:   Neurology PMT  Procedures:   None   Antimicrobials:   As above   Subjective:  Patient nonverbal.  Does not track activity with eyes or follow instructions.  Some spontaneous movements of left upper extremity, hand and mittens.  RN reported some pus in patient's urine and genital area with warts.  Objective:   Vitals:   12/14/19 0130 12/14/19 0329 12/14/19 0758 12/14/19 1150  BP: (!) 110/53 (!) 114/57 130/65 138/67  Pulse: 88 88  89 100  Resp: 18 18 20 16   Temp: 98.5 F (36.9 C) 98.2 F (36.8 C) 98.8 F (37.1 C) 98 F (36.7 C)  TempSrc: Oral Oral Oral Oral  SpO2: 96% 96% 99% 96%  Weight:        General exam: Elderly male, moderately built and nourished lying comfortably propped up in bed without  distress. Respiratory system: Poor inspiratory effort but seems clear to auscultation.  No increased work of breathing. Cardiovascular system: S1 & S2 heard, RRR. No JVD, murmurs, rubs, gallops or clicks. No pedal edema.  Telemetry personally reviewed: Sinus rhythm. Gastrointestinal system: Abdomen is nondistended, soft and nontender. No organomegaly or masses felt. Normal bowel sounds heard. Central nervous system: Appears alert, left eye open but does not track, enucleated (chronic) right eye-phthisis bulbi.  Aphasic.  Facial asymmetry +, diminished right nasolabial fold.  Extremities: Purposeful movement of left upper extremity, touches his chest.  Mild tremors of left upper extremity.  Flaccid right hemiplegia with grade 0 x 5 power. Skin: No rashes, lesions or ulcers Psychiatry: Judgement and insight impaired. Mood & affect cannot be assessed.     Data Reviewed:   I have personally reviewed following labs and imaging studies   CBC: Recent Labs  Lab 12/12/19 1145 12/12/19 1154  WBC 11.6*  --   NEUTROABS 10.3*  --   HGB 11.7* 12.2*  HCT 36.8* 36.0*  MCV 90.9  --   PLT 193  --     Basic Metabolic Panel: Recent Labs  Lab 12/12/19 1145 12/12/19 1154  NA 137 138  K 4.3 4.2  CL 105 106  CO2 21*  --   GLUCOSE 189* 181*  BUN 22 22  CREATININE 1.56* 1.50*  CALCIUM 9.2  --     Liver Function Tests: Recent Labs  Lab 12/12/19 1145  AST 21  ALT 13  ALKPHOS 151*  BILITOT 0.9  PROT 6.6  ALBUMIN 3.3*    CBG: Recent Labs  Lab 12/12/19 1140 12/14/19 0759 12/14/19 1148  GLUCAP 165* 92 85    Microbiology Studies:   Recent Results (from the past 240 hour(s))  Respiratory Panel by RT PCR (Flu A&B, Covid) - Nasopharyngeal Swab     Status: None   Collection Time: 12/12/19 12:08 PM   Specimen: Nasopharyngeal Swab  Result Value Ref Range Status   SARS Coronavirus 2 by RT PCR NEGATIVE NEGATIVE Final    Comment: (NOTE) SARS-CoV-2 target nucleic acids are NOT  DETECTED. The SARS-CoV-2 RNA is generally detectable in upper respiratoy specimens during the acute phase of infection. The lowest concentration of SARS-CoV-2 viral copies this assay can detect is 131 copies/mL. A negative result does not preclude SARS-Cov-2 infection and should not be used as the sole basis for treatment or other patient management decisions. A negative result may occur with  improper specimen collection/handling, submission of specimen other than nasopharyngeal swab, presence of viral mutation(s) within the areas targeted by this assay, and inadequate number of viral copies (<131 copies/mL). A negative result must be combined with clinical observations, patient history, and epidemiological information. The expected result is Negative. Fact Sheet for Patients:  PinkCheek.be Fact Sheet for Healthcare Providers:  GravelBags.it This test is not yet ap proved or cleared by the [place] FDA and  has been authorized for detection and/or diagnosis of SARS-CoV-2 by FDA under an Emergency Use Authorization (EUA). This EUA will remain  in effect (meaning this test can be used) for the duration of the COVID-19 declaration  under Section 564(b)(1) of the Act, 21 U.S.C. section 360bbb-3(b)(1), unless the authorization is terminated or revoked sooner.    Influenza A by PCR NEGATIVE NEGATIVE Final   Influenza B by PCR NEGATIVE NEGATIVE Final    Comment: (NOTE) The Xpert Xpress SARS-CoV-2/FLU/RSV assay is intended as an aid in  the diagnosis of influenza from Nasopharyngeal swab specimens and  should not be used as a sole basis for treatment. Nasal washings and  aspirates are unacceptable for Xpert Xpress SARS-CoV-2/FLU/RSV  testing. Fact Sheet for Patients: https://www.moore.com/ Fact Sheet for Healthcare Providers: https://www.young.biz/ This test is not yet approved or cleared  by the [place] FDA and  has been authorized for detection and/or diagnosis of SARS-CoV-2 by  FDA under an Emergency Use Authorization (EUA). This EUA will remain  in effect (meaning this test can be used) for the duration of the  Covid-19 declaration under Section 564(b)(1) of the Act, 21  U.S.C. section 360bbb-3(b)(1), unless the authorization is  terminated or revoked. Performed at [facility], [address], [place], [place] [address]      Radiology Studies:  No results found.   Scheduled Meds:   .  stroke: mapping our early stages of recovery book   Does not apply Once  . aspirin  300 mg Rectal Daily   Or  . aspirin  325 mg Oral Daily  . chlorhexidine  15 mL Mouth Rinse BID  . latanoprost  1 drop Left Eye QHS  . mouth rinse  15 mL Mouth Rinse q12n4p    Continuous Infusions:   . sodium chloride 50 mL/hr at 12/13/19 1423     LOS: 2 days     Marcellus Scott, MD, [place]. [facility]    To contact the attending provider between 7A-7P or the covering provider during after hours 7P-7A, please log into the web site [email or web address] and access using universal [place] password for that web site. If you do not have the password, please call the hospital operator.  12/14/2019, 2:35 PM

## 2019-12-14 NOTE — Evaluation (Signed)
Speech Language Pathology Evaluation Patient Details Name: Bernard Schmidt MRN: [id number] DOB: 02-22-1936 Today's Date: 12/14/2019 Time: 9767-3419 SLP Time Calculation (min) (ACUTE ONLY): 12 min  Problem List:  Patient Active Problem List   Diagnosis Date Noted  . Acute ischemic left MCA stroke (HCC) 12/12/2019  . Essential hypertension 12/12/2019  . Dyslipidemia 12/12/2019  . DNR (do not resuscitate) 12/12/2019   Past Medical History:  Past Medical History:  Diagnosis Date  . HLD (hyperlipidemia)   . HTN (hypertension)    Past Surgical History: History reviewed. No pertinent surgical history. HPI:  Bernard Schmidt is a 84 y.o. male with medical history significant of HTN and HLD with right-sided weakness.  The patient is unable to provide history.  Per chart review, he lives in a rental home owned by a friend and she reports that she takes him to all of his appointments and makes all of his health care decisions and has necessary paperwork.  MRI on 1/3 was remarkable for "Moderate to large acute infarct within the left MCA vascular territory, as detailed.  Multiple small additional acute infarcts within the bilateral frontal and parietooccipital lobes, right basal ganglia and cerebellum. Correlate for an embolic phenomenon. Chronic right temporal occipital cortically based infarct."  No prior dysphagia or cognitive-linguistic impairment documented.     Assessment / Plan / Recommendation Clinical Impression  Pt presents with global aphasia in the setting of acute infarcts most notably in the pt's L MCA vascular territory.  Additionally suspect apraxia of speech.  Pt was encountered awake/alert with R eye closed throughout this session.  Pt exhibited decreased attention to SLP throughout this evaluation; however, he was able to intermittently maintain eye contact given verbal and visual cues.  Pt was unable to follow basic 1 step commands despite max cues and he did not attempt to vocalize  during this evaluation.  Pt nodded his head slightly to indicate "yes" when asked if his television channel was okay, but he was unable to nod his head, blink his eyes, or squeeze his had to meaningfully answer additional yes/no questions, even basic biographical ones such as his name.  SLP will continue to f/u for diagnostic treatment per POC.      SLP Assessment  SLP Recommendation/Assessment: Patient needs continued Speech Lanaguage Pathology Services SLP Visit Diagnosis: Aphasia (R47.01)    Follow Up Recommendations  24 hour supervision/assistance;Skilled Nursing facility    Frequency and Duration min 2x/week  2 weeks      SLP Evaluation Cognition  Overall Cognitive Status: No family/caregiver present to determine baseline cognitive functioning Arousal/Alertness: Awake/alert Orientation Level: Disoriented to person;Disoriented to place;Disoriented to time;Disoriented to situation Attention: Focused Focused Attention: Impaired Focused Attention Impairment: Verbal basic       Comprehension  Auditory Comprehension Overall Auditory Comprehension: Impaired Yes/No Questions: Impaired Basic Biographical Questions: 0-25% accurate Basic Immediate Environment Questions: 0-24% accurate Commands: Impaired One Step Basic Commands: 0-24% accurate Conversation: Simple Interfering Components: Attention    Expression Expression Primary Mode of Expression: Verbal Verbal Expression Overall Verbal Expression: Impaired Initiation: Impaired Repetition: Impaired Level of Impairment: Word level Naming: Impairment Confrontation: Impaired Pragmatics: Impairment Impairments: Eye contact   Oral / Motor  Oral Motor/Sensory Function Overall Oral Motor/Sensory Function: Other (comment)(Unable to evaluate) Motor Speech Overall Motor Speech: Impaired Respiration: Within functional limits Phonation: Other (comment)(Not attempted )   GO                   Villa Herb., M.S., CCC-SLP Acute  Rehabilitation Services  Office: [phone]  [place] 12/14/2019, 9:51 AM

## 2019-12-14 NOTE — Progress Notes (Signed)
Noticed pus discharge in condom catheter along with warts on scrotum. Condom catheter removed. Dr. Waymon Amato notified. Bernard Schmidt

## 2019-12-14 NOTE — Progress Notes (Signed)
Spoke with on call Dr. Katherina Right about change in MEWS score from green to yellow; no new orders at this time.  RN will continue to monitor patient.

## 2019-12-15 DIAGNOSIS — N1831 Chronic kidney disease, stage 3a: Secondary | ICD-10-CM

## 2019-12-15 DIAGNOSIS — A630 Anogenital (venereal) warts: Secondary | ICD-10-CM

## 2019-12-15 DIAGNOSIS — Z66 Do not resuscitate: Secondary | ICD-10-CM

## 2019-12-15 LAB — URINALYSIS, ROUTINE W REFLEX MICROSCOPIC

## 2019-12-15 LAB — URINALYSIS, MICROSCOPIC (REFLEX): RBC / HPF: >50 RBC/hpf (ref 0–5)

## 2019-12-15 LAB — GLUCOSE, CAPILLARY
Glucose-Capillary: 102 mg/dL — ABNORMAL HIGH (ref 70–99)
Glucose-Capillary: 108 mg/dL — ABNORMAL HIGH (ref 70–99)
Glucose-Capillary: 109 mg/dL — ABNORMAL HIGH (ref 70–99)
Glucose-Capillary: 112 mg/dL — ABNORMAL HIGH (ref 70–99)
Glucose-Capillary: 124 mg/dL — ABNORMAL HIGH (ref 70–99)
Glucose-Capillary: 93 mg/dL (ref 70–99)

## 2019-12-15 MED ORDER — CHLORHEXIDINE GLUCONATE CLOTH 2 % EX PADS
6.0000 | MEDICATED_PAD | Freq: Every day | CUTANEOUS | Status: DC
  Administered 2019-12-16: 6 via TOPICAL

## 2019-12-15 MED ORDER — LIDOCAINE HCL URETHRAL/MUCOSAL 2 % EX GEL
1.0000 "application " | Freq: Once | CUTANEOUS | Status: AC
  Administered 2019-12-15: 1 via URETHRAL
  Filled 2019-12-15: qty 20

## 2019-12-15 NOTE — Progress Notes (Addendum)
PROGRESS NOTE    Bernard Schmidt   OAC:[id number]  DOB: November 11, 1936  DOA: 12/12/2019 PCP: Roger Kill, PA-C   Brief Narrative:  Bernard Schmidt 84 year old male from home, lives alone, PMH of hypertension and hyperlipidemia, last known well 1/2, found lying on the floor on morning of admission admitted for acute large MCA stroke and acute encephalopathy.  Neurology consulted.  Poor prognosis.  PMT consulted for goals of care and input pending.  Subjective: Non verbal    Assessment & Plan:   Principal Problem:   Acute ischemic left MCA stroke -dyslipidemia -Right-sided hemiplegia, aphasia dysphagia and inability to follow commands - MRI brain: Moderate to large acute infarct within the left MCA vascular territory.  Multiple small additional acute infarcts within the bilateral frontal and parieto-occipital lobes, right basal ganglia and cerebellum.  Concern for embolic phenomenon.  Chronic right temporal occipital infarct. - CTA head: Abrupt occlusion of the proximal M1 left MCA without significant distal reconstitution.  High-grade focal stenosis within P3 right PCA. - CTA neck: Bilateral common and internal carotid arteries patent.  No significant narrowing of vertebral arteries in the neck. - CT perfusion head 88 mL core infarct in left MCA territory.  168 mL region of critically hypoperfused parenchyma in the left MCA territory. - TTE: LVEF 60-65%.  Mild LVH.  Grade 1 diastolic dysfunction. - LDL 94.  A1c pending. -The patient continues to be unable to participate PT-SLP recommends n.p.o. 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 which are bleeding - cont conservative management  CKD stage IIIa -Creatinine stable compared to creatinine in 2011    DNR (do not resuscitate)   Time spent in minutes: 35 DVT prophylaxis: SCDs Code Status: DNR Family Communication:  Disposition Plan: awaiting palliative care eval Consultants:   neuro Procedures:    Antimicrobials:  Anti-infectives (From admission, onward)   None       Objective: Vitals:   12/15/19 0008 12/15/19 0400 12/15/19 0727 12/15/19 1125  BP: (!) 148/78 (!) 129/55 138/61 (!) 142/66  Pulse: 100 100 87 91  Resp: 20 18 14 18   Temp: 98.9 F (37.2 C) 99.5 F (37.5 C) 99.8 F (37.7 C) 98.8 F (37.1 C)  TempSrc: Oral Axillary Axillary Axillary  SpO2: 99% 100% 99% 100%  Weight:        Intake/Output Summary (Last 24 hours) at 12/15/2019 1542 Last data filed at 12/15/2019 0219 Gross per 24 hour  Intake -  Output 1200 ml  Net -1200 ml   Filed Weights   12/13/19 1939  Weight: 76.5 kg    Examination: General exam: Appears comfortable  HEENT: PERRLA, conjunctiva pink, no scleral icterus Respiratory system: Clear to auscultation. Respiratory effort normal. Cardiovascular system: S1 & S2 heard, RRR.   Gastrointestinal system: Abdomen soft, non-tender, nondistended. Normal bowel sounds. Central nervous system: right side is flaccid with + babinski on right, left side has normal tone- non verbal and not following commands Extremities: No cyanosis, clubbing or edema Skin: severe  genital lesions consistent with HPV with slow bleeding Psychiatry:  Cannot assess      Data Reviewed: I have personally reviewed following labs and imaging studies  CBC: Recent Labs  Lab 12/12/19 1145 12/12/19 1154  WBC 11.6*  --   NEUTROABS 10.3*  --   HGB 11.7* 12.2*  HCT 36.8* 36.0*  MCV 90.9  --   PLT  193  --    Basic Metabolic Panel: Recent Labs  Lab 12/12/19 1145 12/12/19 1154  NA 137 138  K 4.3 4.2  CL 105 106  CO2 21*  --   GLUCOSE 189* 181*  BUN 22 22  CREATININE 1.56* 1.50*  CALCIUM 9.2  --    GFR: CrCl cannot be calculated (Unknown ideal weight.). Liver Function Tests: Recent Labs  Lab 12/12/19 1145  AST 21  ALT 13  ALKPHOS 151*  BILITOT 0.9  PROT 6.6  ALBUMIN 3.3*   No results for input(s): LIPASE, AMYLASE in the last 168 hours. No results for input(s): AMMONIA in the last 168 hours. Coagulation Profile: Recent Labs  Lab 12/12/19 1145  INR 1.3*   Cardiac Enzymes: Recent Labs  Lab 12/12/19 1145  CKTOTAL 46*   BNP (last 3 results) No results for input(s): PROBNP in the last 8760 hours. HbA1C: No results for input(s): HGBA1C in the last 72 hours. CBG: Recent Labs  Lab 12/14/19 1950 12/14/19 2321 12/15/19 0416 12/15/19 0731 12/15/19 1128  GLUCAP 95 97 124* 102* 109*   Lipid Profile: Recent Labs    12/13/19 0444  CHOL 166  HDL 51  LDLCALC 94  TRIG 105  CHOLHDL 3.3   Thyroid Function Tests: No results for input(s): TSH, T4TOTAL, FREET4, T3FREE, THYROIDAB in the last 72 hours. Anemia Panel: No results for input(s): VITAMINB12, FOLATE, FERRITIN, TIBC, IRON, RETICCTPCT in the last 72 hours. Urine analysis:    Component Value Date/Time   COLORURINE BROWN (A) 12/15/2019 1500   APPEARANCEUR TURBID (A) 12/15/2019 1500   LABSPEC  12/15/2019 1500    TEST NOT REPORTED DUE TO COLOR INTERFERENCE OF URINE PIGMENT   PHURINE  12/15/2019 1500    TEST NOT REPORTED DUE TO COLOR INTERFERENCE OF URINE PIGMENT   GLUCOSEU (A) 12/15/2019 1500    TEST NOT REPORTED DUE TO COLOR INTERFERENCE OF URINE PIGMENT   HGBUR (A) 12/15/2019 1500    TEST NOT REPORTED DUE TO COLOR INTERFERENCE OF URINE PIGMENT   BILIRUBINUR (A) 12/15/2019 1500    TEST NOT REPORTED DUE TO COLOR INTERFERENCE OF URINE PIGMENT   KETONESUR (A) 12/15/2019 1500    TEST NOT REPORTED DUE  TO COLOR INTERFERENCE OF URINE PIGMENT   PROTEINUR (A) 12/15/2019 1500    TEST NOT REPORTED DUE TO COLOR INTERFERENCE OF URINE PIGMENT   NITRITE (A) 12/15/2019 1500    TEST NOT REPORTED DUE TO COLOR INTERFERENCE OF URINE PIGMENT   LEUKOCYTESUR (A) 12/15/2019 1500    TEST NOT REPORTED DUE TO COLOR INTERFERENCE OF URINE PIGMENT   Sepsis Labs: @LABRCNTIP (procalcitonin:4,lacticidven:4) ) Recent Results (from the past 240 hour(s))  Respiratory Panel by RT PCR (Flu A&B, Covid) - Nasopharyngeal Swab     Status: None   Collection Time: 12/12/19 12:08 PM   Specimen: Nasopharyngeal Swab  Result Value Ref Range Status   SARS Coronavirus 2 by RT PCR NEGATIVE NEGATIVE Final    Comment: (NOTE) SARS-CoV-2 target nucleic acids are NOT DETECTED. The SARS-CoV-2 RNA is generally detectable in upper respiratoy specimens during the acute phase of infection. The lowest concentration  of SARS-CoV-2 viral copies this assay can detect is 131 copies/mL. A negative result does not preclude SARS-Cov-2 infection and should not be used as the sole basis for treatment or other patient management decisions. A negative result may occur with  improper specimen collection/handling, submission of specimen other than nasopharyngeal swab, presence of viral mutation(s) within the areas targeted by this assay, and inadequate number of viral copies (<131 copies/mL). A negative result must be combined with clinical observations, patient history, and epidemiological information. The expected result is Negative. Fact Sheet for Patients:  https://www.moore.com/ Fact Sheet for Healthcare Providers:  https://www.young.biz/ This test is not yet ap proved or cleared by the [place] FDA and  has been authorized for detection and/or diagnosis of SARS-CoV-2 by FDA under an Emergency Use Authorization (EUA). This EUA will remain  in effect (meaning this test can be used) for the duration  of the COVID-19 declaration under Section 564(b)(1) of the Act, 21 U.S.C. section 360bbb-3(b)(1), unless the authorization is terminated or revoked sooner.    Influenza A by PCR NEGATIVE NEGATIVE Final   Influenza B by PCR NEGATIVE NEGATIVE Final    Comment: (NOTE) The Xpert Xpress SARS-CoV-2/FLU/RSV assay is intended as an aid in  the diagnosis of influenza from Nasopharyngeal swab specimens and  should not be used as a sole basis for treatment. Nasal washings and  aspirates are unacceptable for Xpert Xpress SARS-CoV-2/FLU/RSV  testing. Fact Sheet for Patients: https://www.moore.com/ Fact Sheet for Healthcare Providers: https://www.young.biz/ This test is not yet approved or cleared by the [place] FDA and  has been authorized for detection and/or diagnosis of SARS-CoV-2 by  FDA under an Emergency Use Authorization (EUA). This EUA will remain  in effect (meaning this test can be used) for the duration of the  Covid-19 declaration under Section 564(b)(1) of the Act, 21  U.S.C. section 360bbb-3(b)(1), unless the authorization is  terminated or revoked. Performed at [facility], [address] [place], [place] [address]          Radiology Studies: No results found.    Scheduled Meds: . aspirin  300 mg Rectal Daily   Or  . aspirin  325 mg Oral Daily  . chlorhexidine  15 mL Mouth Rinse BID  . latanoprost  1 drop Left Eye QHS  . mouth rinse  15 mL Mouth Rinse q12n4p   Continuous Infusions: . sodium chloride 50 mL/hr at 12/15/19 0500     LOS: 3 days      Calvert Cantor, MD [facility] Pager: [email or web address] Password [facility] 12/15/2019, 3:42 PM

## 2019-12-15 NOTE — Progress Notes (Addendum)
PT Cancellation Note  Patient Details Name: Damontre Millea MRN: [id number] DOB: 1936-10-29   Cancelled Treatment:    Reason Eval/Treat Not Completed: Patient's level of consciousness Pt is not responsive to stimuli and unable to meaningfuly participate in therapy at this time. PT signing off; please re-consult if status changes.  Laurina Bustle, PT, DPT Acute Rehabilitation Services Pager [phone] Office [phone]    Vanetta Mulders 12/15/2019, 11:33 AM

## 2019-12-15 NOTE — Progress Notes (Signed)
STROKE TEAM PROGRESS NOTE   INTERVAL HISTORY No one is at the bedside. Pt  remains globally aphasic with right dense hemiplegia and is not following any commands.  And still await family meeting today to discuss palliative care  Vitals:   12/15/19 0008 12/15/19 0400 12/15/19 0727 12/15/19 1125  BP: (!) 148/78 (!) 129/55 138/61 (!) 142/66  Pulse: 100 100 87 91  Resp: 20 18 14 18   Temp: 98.9 F (37.2 C) 99.5 F (37.5 C) 99.8 F (37.7 C) 98.8 F (37.1 C)  TempSrc: Oral Axillary Axillary Axillary  SpO2: 99% 100% 99% 100%  Weight:        CBC:  Recent Labs  Lab 12/12/19 1145 12/12/19 1154  WBC 11.6*  --   NEUTROABS 10.3*  --   HGB 11.7* 12.2*  HCT 36.8* 36.0*  MCV 90.9  --   PLT 193  --     Basic Metabolic Panel:  Recent Labs  Lab 12/12/19 1145 12/12/19 1154  NA 137 138  K 4.3 4.2  CL 105 106  CO2 21*  --   GLUCOSE 189* 181*  BUN 22 22  CREATININE 1.56* 1.50*  CALCIUM 9.2  --    Lipid Panel:     Component Value Date/Time   CHOL 166 12/13/2019 0444   TRIG 105 12/13/2019 0444   HDL 51 12/13/2019 0444   CHOLHDL 3.3 12/13/2019 0444   VLDL 21 12/13/2019 0444   LDLCALC 94 12/13/2019 0444   HgbA1c: No results found for: HGBA1C Urine Drug Screen: No results found for: LABOPIA, COCAINSCRNUR, LABBENZ, AMPHETMU, THCU, LABBARB  Alcohol Level No results found for: ETH  IMAGING past 48 hours No results found.  PHYSICAL EXAM Elderly Caucasian male who is not in distress.  Is unresponsive. . Afebrile. Head is nontraumatic. Neck is supple without bruit.    Cardiac exam no murmur or gallop. Lungs are clear to auscultation. Distal pulses are well felt. Neurological Exam :  Patient is awake but globally aphasic and not speaking or following any commands.  Right eye shows corneal opacity with decreased size.  Left gaze preference.  Right lower facial weakness.  Tongue midline.  Purposeful antigravity movements in the left upper and lower extremity.  Right hemiplegia with trace  withdrawal to noxious stimuli in the upper and lower extremity on the right.  Tone is diminished on the right.  Right plantar upgoing.  Left downgoing.  ASSESSMENT/PLAN Bernard Schmidt is a 84 y.o. male with history of HTN and HLD presenting with side paralysis and left gaze.   Stroke:  Large L MCA and bilateral anterior and posterior circulation scattered infarcts embolic secondary to unknown source  Code Stroke CT head No acute abnormality. Suspicious for hyperdense L M1. Age indeterminate R basal ganglia and L thalamic lacunes. Probable thin frontoparietal hygroma or chronic SDH up to 72mm. Old R temporal occipital lobe infarct. Small vessel disease. Atrophy.   CTA head L M1 occlusion, R P3 stenosis   CTA neck Unremarkable   CT perfusion 88 mL core L MCA, 168 mL penumbra L MCA  MRI  Moderate to large L MCA territory infarct with additional B frontal lobe and parietal occipital lobe, R basal ganglia and R cerebellar infarcts. Old R temporal occipital lobe infarct.  2D Echo EF 60-65%. No source of embolus   LDL 94  HgbA1c No results found for requested labs within last 26280 hours.  SCDs for VTE prophylaxis  No antithrombotic prior to admission, now on aspirin 300  mg suppository daily.   Therapy recommendations:  pending   Disposition:  pending (rents room, does not drive or cook)  Recommend palliative care consult/comfort care  DNR at baseline  Hypertension  Stable . Permissive hypertension (OK if < 220/120) but gradually normalize in 5-7 days . Long-term BP goal normotensive  Hyperlipidemia  Home meds:  lipitor 20  Resume statin once po access, depending on plan of care (no high intensity statin given advanced age)  LDL 94, goal < 70  Continue statin at discharge  Dysphagia . Secondary to stroke . NPO . Speech on board   Other Stroke Risk Factors  Advanced age  Other Active Problems  CKD Stage IIIa  Normocytic anemia  FTT  Hospital day # 3    Patient has presented with a large left MCA infarct with global aphasia and dense right hemiplegia and presented beyond time window for intervention.  His prognosis for making significant recovery and independent survival is slim.  Family has chosen DNR and likely needing towards palliative care approach hence do not recommend aggressive care.  Await family meeting today to discuss goals of care and palliative care approach.Stroke team will sign off. Call for questions.   Delia Heady, MD Medical Director [facility] Pager: [phone] 12/15/2019 1:49 PM   To contact Stroke Continuity provider, please refer to [email or web address]. After hours, contact General Neurology

## 2019-12-15 NOTE — Care Management Important Message (Signed)
Important Message  Patient Details  Name: Bernard Schmidt MRN: [id number] Date of Birth: 12/13/1935   Medicare Important Message Given:        Bernard Schmidt 12/15/2019, 2:05 PM

## 2019-12-15 NOTE — Plan of Care (Signed)
Problem: Education: Goal: Knowledge of disease or condition will improve 12/15/2019 0029 by Gwyndolyn Kaufman, RN Outcome: Progressing 12/14/2019 2107 by Gwyndolyn Kaufman, RN Outcome: Progressing 12/14/2019 2107 by Gwyndolyn Kaufman, RN Outcome: Progressing Goal: Knowledge of secondary prevention will improve 12/15/2019 0029 by Gwyndolyn Kaufman, RN Outcome: Progressing 12/14/2019 2107 by Gwyndolyn Kaufman, RN Outcome: Progressing 12/14/2019 2107 by Gwyndolyn Kaufman, RN Outcome: Progressing Goal: Knowledge of patient specific risk factors addressed and post discharge goals established will improve 12/15/2019 0029 by Gwyndolyn Kaufman, RN Outcome: Progressing 12/14/2019 2107 by Gwyndolyn Kaufman, RN Outcome: Progressing 12/14/2019 2107 by Gwyndolyn Kaufman, RN Outcome: Progressing Goal: Individualized Educational Video(s) 12/15/2019 0029 by Gwyndolyn Kaufman, RN Outcome: Progressing 12/14/2019 2107 by Gwyndolyn Kaufman, RN Outcome: Progressing 12/14/2019 2107 by Gwyndolyn Kaufman, RN Outcome: Progressing   Problem: Coping: Goal: Will verbalize positive feelings about self 12/15/2019 0029 by Gwyndolyn Kaufman, RN Outcome: Progressing 12/14/2019 2107 by Gwyndolyn Kaufman, RN Outcome: Progressing 12/14/2019 2107 by Gwyndolyn Kaufman, RN Outcome: Progressing   Problem: Health Behavior/Discharge Planning: Goal: Ability to manage health-related needs will improve 12/15/2019 0029 by Gwyndolyn Kaufman, RN Outcome: Progressing 12/14/2019 2107 by Gwyndolyn Kaufman, RN Outcome: Progressing 12/14/2019 2107 by Gwyndolyn Kaufman, RN Outcome: Progressing   Problem: Self-Care: Goal: Ability to participate in self-care as condition permits will improve 12/15/2019 0029 by Gwyndolyn Kaufman, RN Outcome: Progressing 12/14/2019 2107 by Gwyndolyn Kaufman, RN Outcome: Progressing 12/14/2019 2107 by Gwyndolyn Kaufman, RN Outcome: Progressing Goal: Verbalization of feelings and concerns over difficulty with self-care will improve 12/15/2019 0029  by Gwyndolyn Kaufman, RN Outcome: Progressing 12/14/2019 2107 by Gwyndolyn Kaufman, RN Outcome: Progressing 12/14/2019 2107 by Gwyndolyn Kaufman, RN Outcome: Progressing Goal: Ability to communicate needs accurately will improve 12/15/2019 0029 by Gwyndolyn Kaufman, RN Outcome: Progressing 12/14/2019 2107 by Gwyndolyn Kaufman, RN Outcome: Progressing 12/14/2019 2107 by Gwyndolyn Kaufman, RN Outcome: Progressing   Problem: Nutrition: Goal: Risk of aspiration will decrease 12/15/2019 0029 by Gwyndolyn Kaufman, RN Outcome: Progressing 12/14/2019 2107 by Gwyndolyn Kaufman, RN Outcome: Progressing 12/14/2019 2107 by Gwyndolyn Kaufman, RN Outcome: Progressing Goal: Dietary intake will improve 12/15/2019 0029 by Gwyndolyn Kaufman, RN Outcome: Progressing 12/14/2019 2107 by Gwyndolyn Kaufman, RN Outcome: Progressing 12/14/2019 2107 by Gwyndolyn Kaufman, RN Outcome: Progressing   Problem: Ischemic Stroke/TIA Tissue Perfusion: Goal: Complications of ischemic stroke/TIA will be minimized 12/15/2019 0029 by Gwyndolyn Kaufman, RN Outcome: Progressing 12/14/2019 2107 by Gwyndolyn Kaufman, RN Outcome: Progressing 12/14/2019 2107 by Gwyndolyn Kaufman, RN Outcome: Progressing   Problem: Education: Goal: Knowledge of General Education information will improve Description: Including pain rating scale, medication(s)/side effects and non-pharmacologic comfort measures 12/15/2019 0029 by Gwyndolyn Kaufman, RN Outcome: Progressing 12/14/2019 2107 by Gwyndolyn Kaufman, RN Outcome: Progressing 12/14/2019 2107 by Gwyndolyn Kaufman, RN Outcome: Progressing   Problem: Health Behavior/Discharge Planning: Goal: Ability to manage health-related needs will improve 12/15/2019 0029 by Gwyndolyn Kaufman, RN Outcome: Progressing 12/14/2019 2107 by Gwyndolyn Kaufman, RN Outcome: Progressing 12/14/2019 2107 by Gwyndolyn Kaufman, RN Outcome: Progressing   Problem: Clinical Measurements: Goal: Ability to maintain clinical measurements within normal limits  will improve 12/15/2019 0029 by Gwyndolyn Kaufman, RN Outcome: Progressing 12/14/2019 2107 by Gwyndolyn Kaufman, RN Outcome: Progressing 12/14/2019 2107 by Gwyndolyn Kaufman, RN Outcome: Progressing Goal: Will remain free from infection 12/15/2019 0029 by Gwyndolyn Kaufman, RN Outcome:  Progressing 12/14/2019 2107 by Yvone Neu, RN Outcome: Progressing 12/14/2019 2107 by Yvone Neu, RN Outcome: Progressing Goal: Diagnostic test results will improve 12/15/2019 0029 by Yvone Neu, RN Outcome: Progressing 12/14/2019 2107 by Yvone Neu, RN Outcome: Progressing 12/14/2019 2107 by Yvone Neu, RN Outcome: Progressing   Problem: Activity: Goal: Risk for activity intolerance will decrease 12/15/2019 0029 by Yvone Neu, RN Outcome: Progressing 12/14/2019 2107 by Yvone Neu, RN Outcome: Progressing 12/14/2019 2107 by Yvone Neu, RN Outcome: Progressing   Problem: Coping: Goal: Level of anxiety will decrease 12/15/2019 0029 by Yvone Neu, RN Outcome: Progressing 12/14/2019 2107 by Yvone Neu, RN Outcome: Progressing 12/14/2019 2107 by Yvone Neu, RN Outcome: Progressing   Problem: Pain Managment: Goal: General experience of comfort will improve 12/15/2019 0029 by Yvone Neu, RN Outcome: Progressing 12/14/2019 2107 by Yvone Neu, RN Outcome: Progressing 12/14/2019 2107 by Yvone Neu, RN Outcome: Progressing   Problem: Safety: Goal: Ability to remain free from injury will improve 12/15/2019 0029 by Yvone Neu, RN Outcome: Progressing 12/14/2019 2107 by Yvone Neu, RN Outcome: Progressing 12/14/2019 2107 by Yvone Neu, RN Outcome: Progressing   Problem: Skin Integrity: Goal: Risk for impaired skin integrity will decrease 12/15/2019 0029 by Yvone Neu, RN Outcome: Progressing 12/14/2019 2107 by Yvone Neu, RN Outcome: Progressing 12/14/2019 2107 by Yvone Neu, RN Outcome: Progressing

## 2019-12-15 NOTE — Progress Notes (Addendum)
OT Cancellation Note  Patient Details Name: Bernard Schmidt MRN: [id number] DOB: Nov 03, 1936   Cancelled Treatment:    Reason Eval/Treat Not Completed: Patient's level of consciousness RN requesting to hold off on OT as pt is not responsive and non-participatory. RN reports palliative meeting planned for today with possibility of pt discharging with hospice. Pt not appropriate for therapy at this time. Should his level of consciousness improve please re-order OT. OT will sign off at this time. Thank you for referral.   Diona Browner OTR/L Acute Rehabilitation Services Office: [phone]   Rebeca Alert 12/15/2019, 11:21 AM

## 2019-12-15 NOTE — Progress Notes (Signed)
  Speech Language Pathology Treatment: Dysphagia  Patient Details Name: Bernard Schmidt MRN: [id number] DOB: [birth year] Today's Date: 12/15/2019 Time: 2952-8413 SLP Time Calculation (min) (ACUTE ONLY): 14 min  Assessment / Plan / Recommendation Clinical Impression  Skilled treatment session targeted PO readiness goals. SLP recieved pt upright in bed, pt appear awake as his left eye was half open (right eye never opened). Although pt didn't appear to be sleeping, he didn't response to any stimulation (including verbal, significant repositioning in bed, cold thermal stimulation to face). SLP attempted oral care but pt's mouth largely remained closed. With some effort, SLP able to enter oral cavity but unable to provide optimal oral care d/t closed mouth position. In an effort to present functional PO stimulation, cup, spoon and ice chip were presented at pt's mouth. No response made. In fact an ice chip feel onto pt's bear chest and pt didn't respond to coldness.   Pt is in most unfortunate situation and is not ready for PO intake d/t decreased mentation and high aspiration risk (documented on BSE 12/13/18). Continue to recommend NPO status. Palliative Care continues to be appropriate referral and are awaiting meeting schedule for this afternoon. Education provided to pt's nurse on NPO recommendation.    HPI HPI: Bernard Schmidt is a 84 y.o. male with medical history significant of HTN and HLD with right-sided weakness.  The patient is unable to provide history.  Per chart review, he lives in a rental home owned by a friend and she reports that she takes him to all of his appointments and makes all of his health care decisions and has necessary paperwork.  MRI on 1/3 was remarkable for "Moderate to large acute infarct within the left MCA vascular territory, as detailed.  Multiple small additional acute infarcts within the bilateral frontal and parietooccipital lobes, right basal ganglia and cerebellum.  Correlate for an embolic phenomenon. Chronic right temporal occipital cortically based infarct."  No prior dysphagia or cognitive-linguistic impairment documented.        SLP Plan  Continue with current plan of care       Recommendations  Diet recommendations: NPO Medication Administration: Via alternative means                Oral Care Recommendations: Oral care QID;Staff/trained caregiver to provide oral care;Oral care prior to ice chip/H20 Follow up Recommendations: (Palliative Care referral) SLP Visit Diagnosis: Dysphagia, unspecified (R13.10) Plan: Continue with current plan of care       GO                Bernard Schmidt 12/15/2019, 11:49 AM

## 2019-12-16 DIAGNOSIS — Z7189 Other specified counseling: Secondary | ICD-10-CM

## 2019-12-16 DIAGNOSIS — Z515 Encounter for palliative care: Secondary | ICD-10-CM

## 2019-12-16 LAB — URINE CULTURE: Culture: NO GROWTH

## 2019-12-16 LAB — GLUCOSE, CAPILLARY
Glucose-Capillary: 111 mg/dL — ABNORMAL HIGH (ref 70–99)
Glucose-Capillary: 111 mg/dL — ABNORMAL HIGH (ref 70–99)
Glucose-Capillary: 101 mg/dL — ABNORMAL HIGH (ref 70–99)

## 2019-12-16 MED ORDER — GLYCOPYRROLATE 0.2 MG/ML IJ SOLN: 0.2000 mg | INTRAMUSCULAR | Status: DC | PRN

## 2019-12-16 MED ORDER — MORPHINE SULFATE (CONCENTRATE) 10 MG/0.5ML PO SOLN: 5.0000 mg | ORAL | Status: DC | PRN

## 2019-12-16 MED ORDER — BIOTENE DRY MOUTH MT LIQD: 15.0000 mL | OROMUCOSAL | Status: DC | PRN

## 2019-12-16 MED ORDER — ACETAMINOPHEN 650 MG RE SUPP: 650.0000 mg | RECTAL | 0 refills | Status: AC | PRN

## 2019-12-16 MED ORDER — HALOPERIDOL 0.5 MG PO TABS: 0.5000 mg | ORAL_TABLET | ORAL | 0 refills | Status: AC | PRN

## 2019-12-16 MED ORDER — HALOPERIDOL 0.5 MG PO TABS: 0.5000 mg | ORAL_TABLET | ORAL | Status: DC | PRN

## 2019-12-16 MED ORDER — MORPHINE SULFATE (CONCENTRATE) 10 MG/0.5ML PO SOLN: 5.0000 mg | ORAL | 0 refills | Status: AC | PRN

## 2019-12-16 MED ORDER — POLYVINYL ALCOHOL 1.4 % OP SOLN
1.0000 [drp] | Freq: Four times a day (QID) | OPHTHALMIC | Status: DC | PRN
  Filled 2019-12-16: qty 15

## 2019-12-16 MED ORDER — ONDANSETRON 4 MG PO TBDP: 4.0000 mg | ORAL_TABLET | Freq: Four times a day (QID) | ORAL | Status: DC | PRN

## 2019-12-16 MED ORDER — GLYCOPYRROLATE 1 MG PO TABS
1.0000 mg | ORAL_TABLET | ORAL | Status: DC | PRN
  Filled 2019-12-16: qty 1

## 2019-12-16 MED ORDER — HALOPERIDOL LACTATE 2 MG/ML PO CONC
0.5000 mg | ORAL | Status: DC | PRN
  Filled 2019-12-16: qty 0.3

## 2019-12-16 MED ORDER — HALOPERIDOL LACTATE 5 MG/ML IJ SOLN: 0.5000 mg | INTRAMUSCULAR | Status: DC | PRN

## 2019-12-16 MED ORDER — ONDANSETRON HCL 4 MG/2ML IJ SOLN: 4.0000 mg | Freq: Four times a day (QID) | INTRAMUSCULAR | Status: DC | PRN

## 2019-12-16 MED ORDER — HALOPERIDOL LACTATE 2 MG/ML PO CONC: 0.5000 mg | ORAL | 0 refills | Status: AC | PRN

## 2019-12-16 MED ORDER — GLYCOPYRROLATE 1 MG PO TABS: 1.0000 mg | ORAL_TABLET | ORAL | 0 refills | Status: AC | PRN

## 2019-12-16 NOTE — TOC Transition Note (Addendum)
Transition of Care [facility]) - CM/SW Discharge Note   Patient Details  Name: Bernard Schmidt MRN: [id number] Date of Birth: 1936/03/20  Transition of Care [facility]) CM/SW Contact:  Kermit Balo, RN Phone Number: 12/16/2019, 3:11 PM   Clinical Narrative:    CM consulted by palliative care for residential hospice at [facility] for the patient. CM reached out to Carley Hammed with [employer] [facility]	[place] and she has spoken with patients POA and plan is to try and get him to [facility] today. MD updated. Awaiting POA to finish paperwork for [facility]. CM has placed d/c packet at the desk. PTAR will need to be arranged once paperwork and d/c completed.  TOC following.  Addendum: pt discharging to [facility]. PTAR arranged. Bedside RN updated.    Final next level of care: Hospice Medical Facility Barriers to Discharge: No Barriers Identified   Patient Goals and CMS Choice        Discharge Placement                       Discharge Plan and Services                                     Social Determinants of Health (SDOH) Interventions     Readmission Risk Interventions No flowsheet data found.

## 2019-12-16 NOTE — Progress Notes (Signed)
PROGRESS NOTE    Bernard Schmidt   UXL:[id number]  DOB: 1936-07-11  DOA: 12/12/2019 PCP: Heywood Bene, PA-C   Brief Narrative:  Patient is an 84 year old male from home, lives alone, with past medical history significant for hypertension and hyperlipidemia.  Patient was last known to be well on 0 12/11/2019.  Patient was found lying on the floor on morning of admission.  Patient was admitted with acute large MCA stroke and acute encephalopathy.  Neurology consulted.  Poor prognosis.  PMT consulted for goals of care and input pending.  12/16/2019: Patient seen alongside patient's nurse.  No new changes.  Patient has Foley catheter in situ, with bloody urine and blood around the perineal area.  Still awaiting palliative care input.  Further management depend on hospital course.  Subjective: Non verbal and lethargic     Assessment & Plan:   Principal Problem:   Acute ischemic left MCA stroke -dyslipidemia -Right-sided hemiplegia, aphasia dysphagia and inability to follow commands - MRI brain: Moderate to large acute infarct within the left MCA vascular territory.  Multiple small additional acute infarcts within the bilateral frontal and parieto-occipital lobes, right basal ganglia and cerebellum.  Concern for embolic phenomenon.  Chronic right temporal occipital infarct. - CTA head: Abrupt occlusion of the proximal M1 left MCA without significant distal reconstitution.  High-grade focal stenosis within P3 right PCA. - CTA neck: Bilateral common and internal carotid arteries patent.  No significant narrowing of vertebral arteries in the neck. - CT perfusion head 88 mL core infarct in left MCA territory.  168 mL region of critically hypoperfused parenchyma in the left MCA territory. - TTE: LVEF 60-65%.  Mild LVH.  Grade 1 diastolic dysfunction. - LDL 94.  A1c pending. -The patient continues to be unable to participate PT-SLP recommends n.p.o. -Neurology has signed off -Family has  decided on DNR-awaiting palliative care eval -Continue slow continuous IV fluids 12/16/2019: Guarded prognosis.  Await palliative care team.  Patient is hospice appropriate.    Essential hypertension -Allowing permissive hypertension for now 12/15/2018: Last documented blood pressure was 140/59 mmHg  Urinary retention -The RN has told me that the patient has had to be in and out cathed twice-there was a small amount of blood at 1 point on the first cath and a large amount of pus on the second cath -Foley requested-urine sent to the lab-UA reveals a large amount of blood 12/15/2018: Bladder scan to rule out obstruction.  We will have a low threshold to proceed with continuous irrigation (three-way catheter).  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  Guarded prognosis   Time spent in minutes: 35 DVT prophylaxis: SCDs Code Status: DNR Family Communication:  Disposition Plan: awaiting palliative care eval Consultants:   neuro Procedures:    Antimicrobials:  Anti-infectives (From admission, onward)   None       Objective: Vitals:   12/15/19 1556 12/15/19 2012 12/15/19 2354 12/16/19 0344  BP:  (!) 145/61 139/66 (!) 140/59  Pulse:  79 77 81  Resp:  18 18 18   Temp: 98.9 F (37.2 C) 98.7 F (37.1 C) 98.5 F (36.9 C) 98.7 F (37.1 C)  TempSrc: Oral Oral Oral Oral  SpO2:  99% 98% 98%  Weight:        Intake/Output Summary (Last 24 hours) at 12/16/2019 1112 Last data filed  at 12/15/2019 2354 Gross per 24 hour  Intake -  Output 600 ml  Net -600 ml   Filed Weights   12/13/19 1939  Weight: 76.5 kg    Examination: General exam: Not communicative.  Lethargic. HEENT: Mild pallor.  No jaundice.   Respiratory system: Clear to auscultation. Respiratory effort normal. Cardiovascular system: S1 & S2, query irregular  Gastrointestinal system: Abdomen soft, non-tender, nondistended. Normal bowel sounds. Central nervous system: right sided weakness/hemiplegia (patient would not comply with full neurological examination) Extremities: No edema  Data Reviewed: I have personally reviewed following labs and imaging studies  CBC: Recent Labs  Lab 12/12/19 1145 12/12/19 1154  WBC 11.6*  --   NEUTROABS 10.3*  --   HGB 11.7* 12.2*  HCT 36.8* 36.0*  MCV 90.9  --   PLT 193  --    Basic Metabolic Panel: Recent Labs  Lab 12/12/19 1145 12/12/19 1154  NA 137 138  K 4.3 4.2  CL 105 106  CO2 21*  --   GLUCOSE 189* 181*  BUN 22 22  CREATININE 1.56* 1.50*  CALCIUM 9.2  --    GFR: CrCl cannot be calculated (Unknown ideal weight.). Liver Function Tests: Recent Labs  Lab 12/12/19 1145  AST 21  ALT 13  ALKPHOS 151*  BILITOT 0.9  PROT 6.6  ALBUMIN 3.3*   No results for input(s): LIPASE, AMYLASE in the last 168 hours. No results for input(s): AMMONIA in the last 168 hours. Coagulation Profile: Recent Labs  Lab 12/12/19 1145  INR 1.3*   Cardiac Enzymes: Recent Labs  Lab 12/12/19 1145  CKTOTAL 46*   BNP (last 3 results) No results for input(s): PROBNP in the last 8760 hours. HbA1C: No results for input(s): HGBA1C in the last 72 hours. CBG: Recent Labs  Lab 12/15/19 1637 12/15/19 2006 12/15/19 2357 12/16/19 0340 12/16/19 0804  GLUCAP 93 108* 112* 111* 111*   Lipid Profile: No results for input(s): CHOL, HDL, LDLCALC, TRIG, CHOLHDL, LDLDIRECT in the last 72 hours. Thyroid Function Tests: No results for input(s): TSH, T4TOTAL, FREET4, T3FREE, THYROIDAB in the last 72 hours. Anemia Panel: No results for input(s): VITAMINB12, FOLATE, FERRITIN, TIBC, IRON, RETICCTPCT in the last 72 hours. Urine analysis:    Component Value Date/Time   COLORURINE BROWN (A) 12/15/2019 1500   APPEARANCEUR TURBID (A) 12/15/2019 1500   LABSPEC  12/15/2019 1500    TEST NOT REPORTED DUE TO COLOR  INTERFERENCE OF URINE PIGMENT   PHURINE  12/15/2019 1500    TEST NOT REPORTED DUE TO COLOR INTERFERENCE OF URINE PIGMENT   GLUCOSEU (A) 12/15/2019 1500    TEST NOT REPORTED DUE TO COLOR INTERFERENCE OF URINE PIGMENT   HGBUR (A) 12/15/2019 1500    TEST NOT REPORTED DUE TO COLOR INTERFERENCE OF URINE PIGMENT   BILIRUBINUR (A) 12/15/2019 1500    TEST NOT REPORTED DUE TO COLOR INTERFERENCE OF URINE PIGMENT   KETONESUR (A) 12/15/2019 1500    TEST NOT REPORTED DUE TO COLOR INTERFERENCE OF URINE PIGMENT   PROTEINUR (A) 12/15/2019 1500    TEST NOT REPORTED DUE TO COLOR INTERFERENCE OF URINE PIGMENT   NITRITE (A) 12/15/2019 1500    TEST NOT REPORTED DUE TO COLOR INTERFERENCE OF URINE PIGMENT   LEUKOCYTESUR (A) 12/15/2019 1500    TEST NOT REPORTED DUE TO COLOR INTERFERENCE OF URINE PIGMENT   Sepsis Labs: @LABRCNTIP (procalcitonin:4,lacticidven:4) ) Recent Results (from the past 240 hour(s))  Respiratory Panel by RT PCR (Flu A&B, Covid) - Nasopharyngeal Swab  Status: None   Collection Time: 12/12/19 12:08 PM   Specimen: Nasopharyngeal Swab  Result Value Ref Range Status   SARS Coronavirus 2 by RT PCR NEGATIVE NEGATIVE Final    Comment: (NOTE) SARS-CoV-2 target nucleic acids are NOT DETECTED. The SARS-CoV-2 RNA is generally detectable in upper respiratoy specimens during the acute phase of infection. The lowest concentration of SARS-CoV-2 viral copies this assay can detect is 131 copies/mL. A negative result does not preclude SARS-Cov-2 infection and should not be used as the sole basis for treatment or other patient management decisions. A negative result may occur with  improper specimen collection/handling, submission of specimen other than nasopharyngeal swab, presence of viral mutation(s) within the areas targeted by this assay, and inadequate number of viral copies (<131 copies/mL). A negative result must be combined with clinical observations, patient history, and epidemiological  information. The expected result is Negative. Fact Sheet for Patients:  https://www.moore.com/ Fact Sheet for Healthcare Providers:  https://www.young.biz/ This test is not yet ap proved or cleared by the [place] FDA and  has been authorized for detection and/or diagnosis of SARS-CoV-2 by FDA under an Emergency Use Authorization (EUA). This EUA will remain  in effect (meaning this test can be used) for the duration of the COVID-19 declaration under Section 564(b)(1) of the Act, 21 U.S.C. section 360bbb-3(b)(1), unless the authorization is terminated or revoked sooner.    Influenza A by PCR NEGATIVE NEGATIVE Final   Influenza B by PCR NEGATIVE NEGATIVE Final    Comment: (NOTE) The Xpert Xpress SARS-CoV-2/FLU/RSV assay is intended as an aid in  the diagnosis of influenza from Nasopharyngeal swab specimens and  should not be used as a sole basis for treatment. Nasal washings and  aspirates are unacceptable for Xpert Xpress SARS-CoV-2/FLU/RSV  testing. Fact Sheet for Patients: https://www.moore.com/ Fact Sheet for Healthcare Providers: https://www.young.biz/ This test is not yet approved or cleared by the [place] FDA and  has been authorized for detection and/or diagnosis of SARS-CoV-2 by  FDA under an Emergency Use Authorization (EUA). This EUA will remain  in effect (meaning this test can be used) for the duration of the  Covid-19 declaration under Section 564(b)(1) of the Act, 21  U.S.C. section 360bbb-3(b)(1), unless the authorization is  terminated or revoked. Performed at [facility], [address], [place], [place] [address]          Radiology Studies: No results found.    Scheduled Meds: . aspirin  300 mg Rectal Daily   Or  . aspirin  325 mg Oral Daily  . chlorhexidine  15 mL Mouth Rinse BID  . Chlorhexidine Gluconate Cloth  6 each Topical Daily  . latanoprost  1  drop Left Eye QHS  . mouth rinse  15 mL Mouth Rinse q12n4p   Continuous Infusions: . sodium chloride 50 mL/hr at 12/16/19 0105     LOS: 4 days      Barnetta Chapel, MD [facility] Pager: [email or web address] Password [email or web address] 12/16/2019, 11:12 AM

## 2019-12-16 NOTE — Progress Notes (Signed)
Patient is discharging to [facility]. Report has been called. PTAR called for transport. All belongings to be sent with patient. Discharge paperwork sent with transport. Bernard Schmidt

## 2019-12-16 NOTE — Consult Note (Signed)
Consultation Note Date: 12/16/2019   Patient Name: Bernard Schmidt  DOB: [birth year]  MRN: [id number]  Age / Sex: 84 y.o., male  PCP: Heywood Bene, PA-C Referring Physician: Bonnell Public, MD  Reason for Consultation: Establishing goals of care  HPI/Patient Profile: 84 y.o. male  with past medical history of HTN, HLD admitted on 12/12/2019 after found on the floor of his home by a friend. Found to have with right-sided weakness due to mod-large L MCA infarct with scattered other acute infarcts according to MRI. He continues with poor responsiveness and NPO status. Overall prognosis is poor.   Clinical Assessment and Goals of Care: I met today at Bernard Schmidt' bedside. No family/friends at bedside. He does look at me but unable to follow any commands (I.e. blink eyes, wiggle toes, stick out tongue). He does not speak at all. Provided reassurance to him and explained that I will call Louise.   I spoke with friend, Bernard Schmidt, who tells me that she has been close friends and caring for Bernard Schmidt for the past 11 years. 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 She is his closest friend and they have friends they Schmidt from their church so it seems he does have good support. He has 1 living sister in [place] but they are estranged. Bernard Schmidt tells me that she has his Living Will and HCPOA and he has been very clear about his directives. He would never want to be kept alive with machines or feeding tubes especially in the state he is in currently. We discussed the option to focus on comfort care and transition to hospice facility and she agrees. She tells me that she is torn as he has clear instructions not to contact his sister even if he were to die as Bernard Schmidt feels that sister should know but will follow his wishes and requests. She wants to ensure Mr. Bernard Schmidt is comfortable and  does not suffer.   Primary Decision Maker HCPOA friend Bernard Schmidt    SUMMARY OF RECOMMENDATIONS   - Full comfort care - Hopeful for hospice facility  Code Status/Advance Care Planning:  DNR   Symptom Management:   PRN comfort medications added to ensure comfort.   Comfort feeds from floorstock as desired/enjoyed. Frequent oral care recommended.   Palliative Prophylaxis:   Bowel Regimen, Delirium Protocol, Frequent Pain Assessment, Oral Care and Turn Reposition  Additional Recommendations (Limitations, Scope, Preferences):  Full Comfort Care  Psycho-social/Spiritual:   Desire for further Chaplaincy support:no  Additional Recommendations: Education on Hospice and Grief/Bereavement Support  Prognosis:   < 2 weeks  Discharge Planning: Hospice facility      Primary Diagnoses: Present on Admission: . Acute ischemic left MCA stroke ([place]) . Essential hypertension . Dyslipidemia . DNR (do not resuscitate)   I have reviewed the medical record, interviewed the patient and family, and examined the patient. The following aspects are pertinent.  Past Medical History:  Diagnosis Date  . HLD (hyperlipidemia)   .  HTN (hypertension)    Social History   Socioeconomic History  . Marital status: Single    Spouse name: Not on file  . Number of children: Not on file  . Years of education: Not on file  . Highest education level: Not on file  Occupational History  . Not on file  Tobacco Use  . Smoking status: Not on file  Substance and Sexual Activity  . Alcohol use: Not on file  . Drug use: Not on file  . Sexual activity: Not on file  Other Topics Concern  . Not on file  Social History Narrative  . Not on file   Social Determinants of Health   Financial Resource Strain:   . Difficulty of Paying Living Expenses: Not on file  Food Insecurity:   . Worried About Charity fundraiser in the Last Year: Not on file  . Ran Out of Food in the Last Year: Not on  file  Transportation Needs:   . Lack of Transportation (Medical): Not on file  . Lack of Transportation (Non-Medical): Not on file  Physical Activity:   . Days of Exercise per Week: Not on file  . Minutes of Exercise per Session: Not on file  Stress:   . Feeling of Stress : Not on file  Social Connections:   . Frequency of Communication with Friends and Family: Not on file  . Frequency of Social Gatherings with Friends and Family: Not on file  . Attends Religious Services: Not on file  . Active Member of Clubs or Organizations: Not on file  . Attends Archivist Meetings: Not on file  . Marital Status: Not on file   No family history on file. Scheduled Meds: . aspirin  300 mg Rectal Daily   Or  . aspirin  325 mg Oral Daily  . chlorhexidine  15 mL Mouth Rinse BID  . Chlorhexidine Gluconate Cloth  6 each Topical Daily  . latanoprost  1 drop Left Eye QHS  . mouth rinse  15 mL Mouth Rinse q12n4p   Continuous Infusions: . sodium chloride 50 mL/hr at 12/16/19 0105   PRN Meds:.acetaminophen **OR** acetaminophen (TYLENOL) oral liquid 160 mg/5 mL **OR** acetaminophen No Known Allergies Review of Systems  Unable to perform ROS: Acuity of condition    Physical Exam Vitals and nursing note reviewed.  Constitutional:      General: He is not in acute distress.    Appearance: He is ill-appearing.  Cardiovascular:     Rate and Rhythm: Normal rate.  Pulmonary:     Effort: Pulmonary effort is normal. No tachypnea, accessory muscle usage or respiratory distress.     Comments: Shallow breathes Abdominal:     Palpations: Abdomen is soft.     Tenderness: There is no abdominal tenderness.  Neurological:     Mental Status: He is alert.     Comments: Non-verbal; does not follow any commands     Vital Signs: BP (!) 140/59 (BP Location: Left Arm)   Pulse 81   Temp 98.7 F (37.1 C) (Oral)   Resp 18   Wt 76.5 kg   SpO2 98%  Pain Scale: 0-10   Pain Score: 0-No  pain   SpO2: SpO2: 98 % O2 Device:SpO2: 98 % O2 Flow Rate: .   IO: Intake/output summary:   Intake/Output Summary (Last 24 hours) at 12/16/2019 1107 Last data filed at 12/15/2019 2354 Gross per 24 hour  Intake --  Output 600 ml  Net -600  ml    LBM: Last BM Date: (pta) Baseline Weight: Weight: 76.5 kg Most recent weight: Weight: 76.5 kg     Palliative Assessment/Data:     Time In: 1130 Time Out: 1210 Time Total: 40 min Greater than 50%  of this time was spent counseling and coordinating care related to the above assessment and plan.  Signed by: Vinie Sill, NP Palliative Medicine Team Pager # [phone] (M-F 8a-5p) Team Phone # [phone] (Nights/Weekends)

## 2019-12-16 NOTE — Progress Notes (Signed)
[facility] room available for Mr. Laffoon today.  Paper work completed by his [employer].  Please send discharge summary to [phone] prior to patient leaving the unit. RN please call report to [phone] prior to patient leaving the unit.   Thank you,  Forrestine Him, LCSW [phone]

## 2019-12-16 NOTE — Discharge Summary (Signed)
Physician Discharge Summary  Patient ID: Bernard Schmidt MRN: [id number] DOB/AGE: 1936-03-28 84 y.o.  Admit date: 12/12/2019 Discharge date: 12/16/2019  Admission Diagnoses:  Discharge Diagnoses:  Principal Problem:   Acute ischemic left MCA stroke [facility]) Active Problems:   Essential hypertension   Dyslipidemia   DNR (do not resuscitate)   Goals of care, counseling/discussion   Encounter for hospice care discussion   Palliative care encounter   Discharged Condition: stable  Hospital Course: Patient is an 84 year old male from home, lives alone, with past medical history significant for hypertension and hyperlipidemia.  Patient was last known to be well on 12/11/2019.  Patient was found lying on the floor on the morning of admission.  Patient was admitted with acute large MCA stroke and acute encephalopathy.  Patient was admitted for further assessment and management.  Neurology team was consulted to assist with patient's management.  Patient prognosis was deemed to be poor.  Palliative care team was consulted.  The decision has been made to discharge patient to hospice house, with comfort directed care.  Acute ischemic left MCA stroke -dyslipidemia -Right-sided hemiplegia, aphasia dysphagia and inability to follow commands noted on presentation -Imaging studies are as documented below.  - TTE: LVEF 60-65%. Mild LVH. Grade 1 diastolic dysfunction. - LDL 94.  -Due to her guarded/poor prognosis, goal of care was discussed and it was decided to proceed with comfort directed care.  Essential hypertension -Patient was managed utilizing permissive hypertension.    Urinary retention -Foley catheter was not started.   -Bloody urine, as well as, blood around the penile meatus and scrotal area noted.   -Overload this could review 24 cc of urine.   -Continue to monitor closely.    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  Moderate to large acute infarct within the left MCA vascular territory, as detailed.  Multiple small additional acute infarcts within the bilateral frontal and parietooccipital lobes, right basal ganglia and cerebellum. Correlate for an embolic phenomenon.  Chronic right temporal occipital cortically based infarct.  Background of moderate chronic small vessel ischemic disease. Chronic lacunar infarcts within the bilateral basal ganglia and Thalami.  CT angio head and neck with and without contrast revealed: CTA neck:  1. The bilateral common and internal carotid arteries are patent without significant stenosis. Mild atherosclerotic plaque within the bilateral carotid systems, as described. 2. Moderate narrowing of the right vertebral artery at the C3-C4 level due to uncinate/facet hypertrophy. No significant atherosclerotic narrowing of the vertebral arteries within the neck.  CTA head:  1. 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 2. No other proximal large vessel occlusion identified. 3. High-grade focal stenosis within the P3 right PCA of uncertain chronicity.  CT perfusion head:  1. Significant motion degradation may limit reliability of the perfusion data. 2. The perfusion software identifies an 88 mL core infarct in the left MCA territory. The perfusion software identifies a 168 mL region of critically hypoperfused parenchyma in the left MCA territory. Reported mismatch volume 80 mL. Reported mismatch ratio 1.9. This suggest a much larger core infarct than is identifiable on the noncontrast head CT.  Discharge Exam: Blood pressure (!) 161/74,  pulse 80, temperature 99.6 F (37.6 C),  temperature source Axillary, resp. rate 20, weight 76.5 kg, SpO2 99 %.  Disposition: Discharge disposition: 51-Hospice/Medical Facility  Discharge Instructions    Diet - low sodium heart healthy   Complete by: As directed    Increase activity slowly   Complete by: As directed      Allergies as of 12/16/2019   No Known Allergies     Medication List    STOP taking these medications   atorvastatin 20 MG tablet Commonly known as: LIPITOR   benazepril 20 MG tablet Commonly known as: LOTENSIN   latanoprost 0.005 % ophthalmic solution Commonly known as: XALATAN     TAKE these medications   acetaminophen 650 MG suppository Commonly known as: TYLENOL Place 1 suppository (650 mg total) rectally every 4 (four) hours as needed for mild pain (or temp > 37.5 C (99.5 F)).   glycopyrrolate 1 MG tablet Commonly known as: ROBINUL Take 1 tablet (1 mg total) by mouth every 4 (four) hours as needed (excessive secretions).   haloperidol 0.5 MG tablet Commonly known as: HALDOL Take 1 tablet (0.5 mg total) by mouth every 4 (four) hours as needed for agitation (or delirium).   haloperidol 2 MG/ML solution Commonly known as: HALDOL Place 0.3 mLs (0.6 mg total) under the tongue every 4 (four) hours as needed for agitation (or delirium).   morphine CONCENTRATE 10 MG/0.5ML Soln concentrated solution Place 0.25 mLs (5 mg total) under the tongue every 2 (two) hours as needed for severe pain or shortness of breath.        SignedBarnetta Chapel 12/16/2019, 3:30 PM

## 2019-12-16 NOTE — Progress Notes (Signed)
SLP Cancellation Note  Patient Details Name: Ravi Tuccillo MRN: [id number] DOB: 1936/03/14   Cancelled treatment:        Patient transferred to comfort care, SLP services signing off.    Shella Spearing, M.Ed., CCC-SLP Speech Therapy Acute Rehabilitation 12/16/2019, 1:00 PM

## 2019-12-17 NOTE — Progress Notes (Addendum)
Patient is discharged to [facility], picked up by [facility] at 0005 am;  pt's belongings handed to [facility].Marland Kitchen

## 2020-01-10 DEATH — deceased

## 2020-06-11 IMAGING — CT CT HEAD CODE STROKE
4 series · 15 of 47 positions shown, 17 images · non-contrast
Comparison: No pertinent prior studies available for comparison.

CLINICAL DATA: Code stroke. Neuro deficit, acute, stroke suspected.
Sudden onset right-sided weakness, aphasia, left gaze

EXAM:
CT HEAD WITHOUT CONTRAST
TECHNIQUE: Contiguous axial images were obtained from the base of the skull
through the vertex without intravenous contrast.

[Series 3: head wo · axial · 0.42mm/px · z∈[-105,+20]mm · 6 of 35 slices shown, 8 images]
[im 5/35  brain]
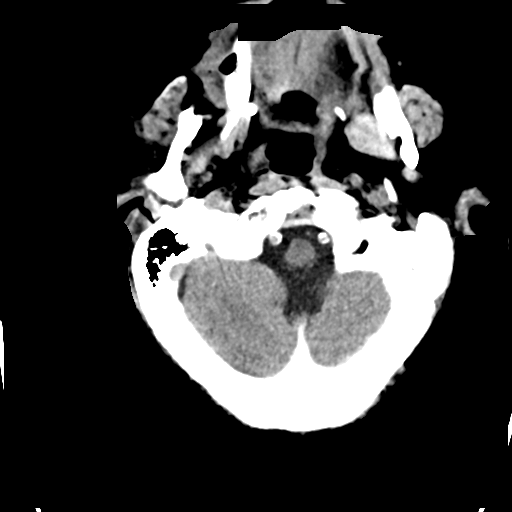
[im 5/35  bone]
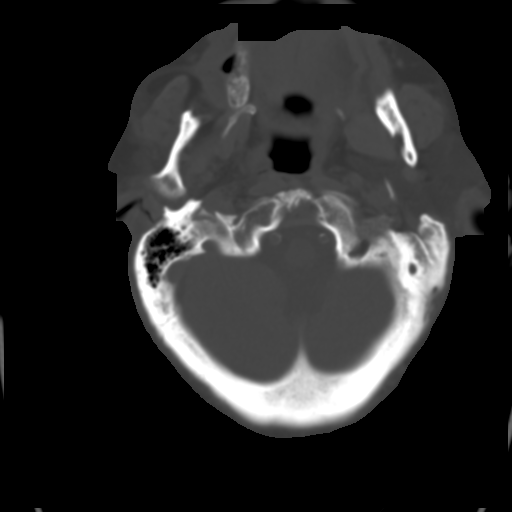
[im 10/35  brain]
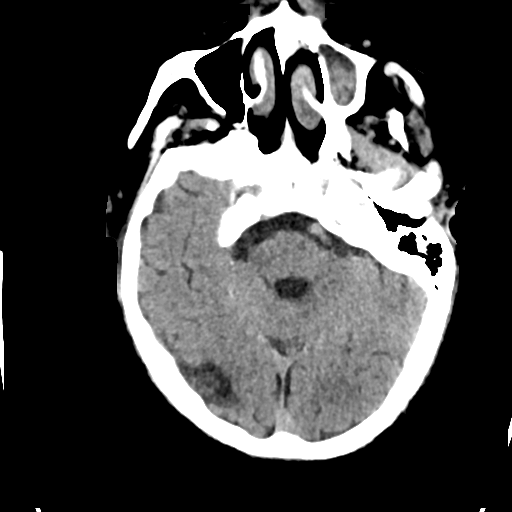
[im 15/35  brain]
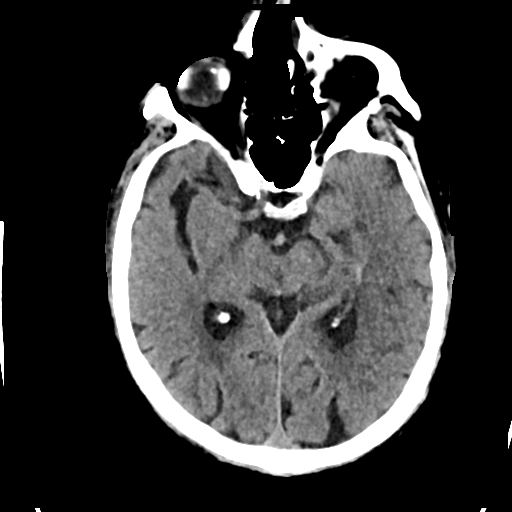
[im 20/35  brain]
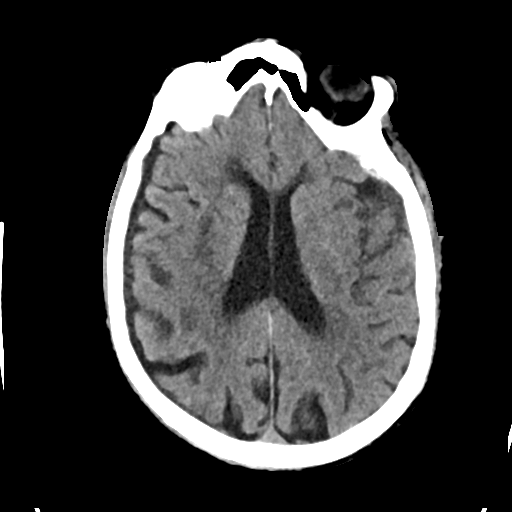
[im 25/35  brain]
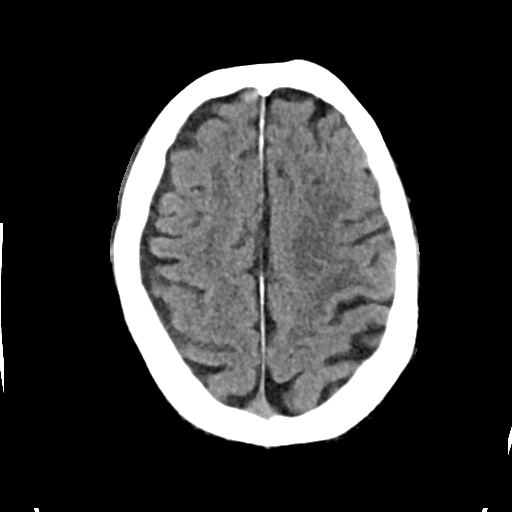
[im 25/35  bone]
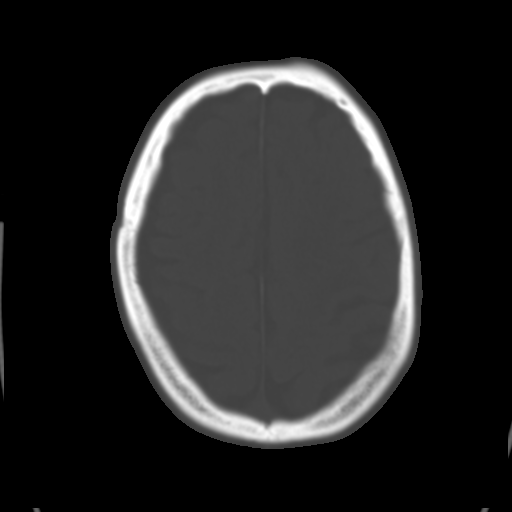
[im 30/35  brain]
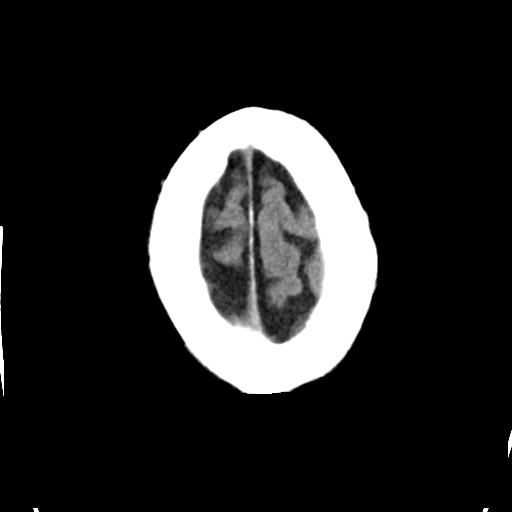

[Series 4: head bone · axial · 0.42mm/px · z∈[-109,-67]mm · 3 of 89 slices shown]
[im 9/89  bone]
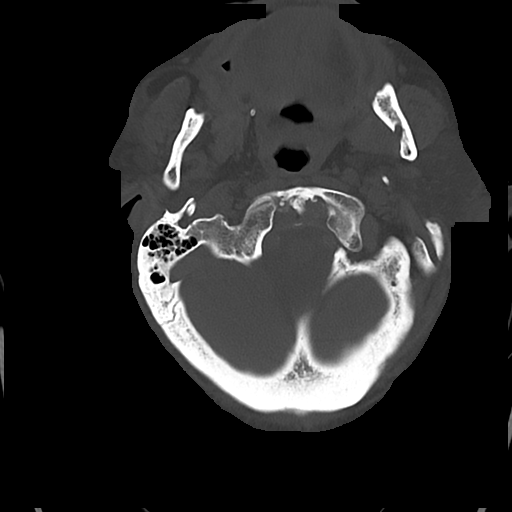
[im 17/89  bone]
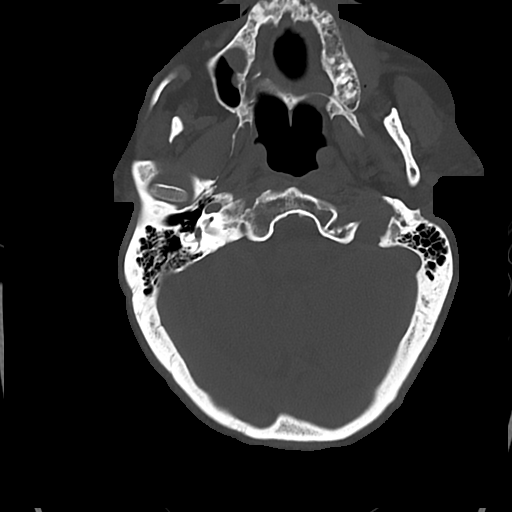
[im 30/89  bone]
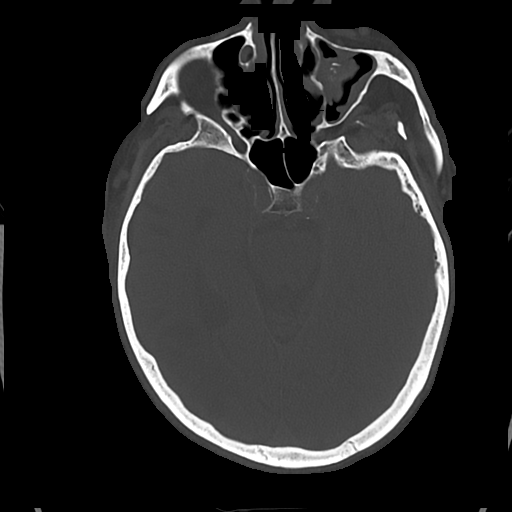

[Series 5: cor soft · coronal · 0.33mm/px · 3 of 72 slices shown]
[im 27/72  brain]
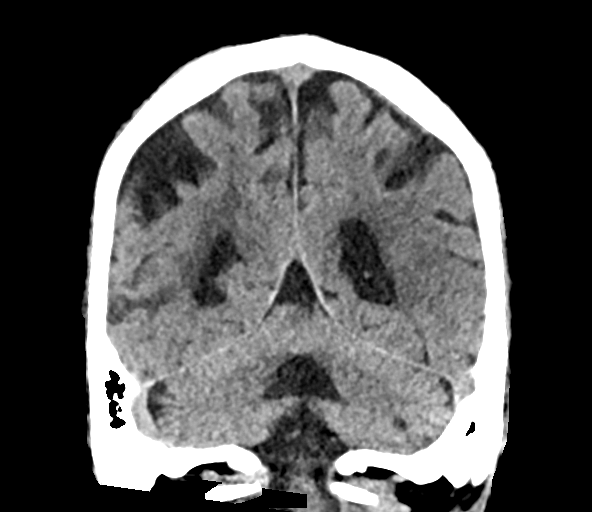
[im 33/72  brain]
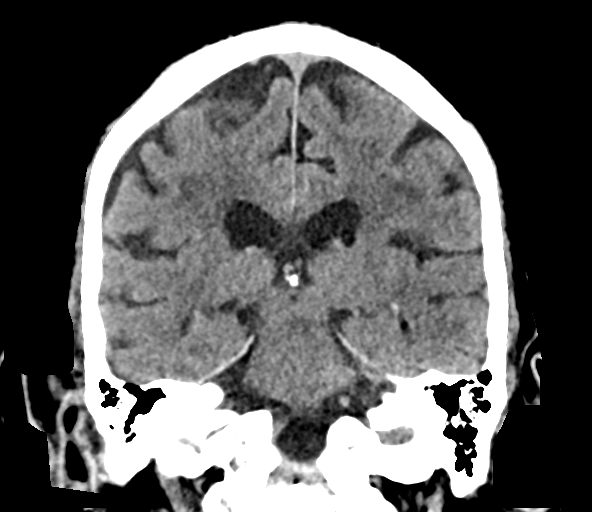
[im 39/72  brain]
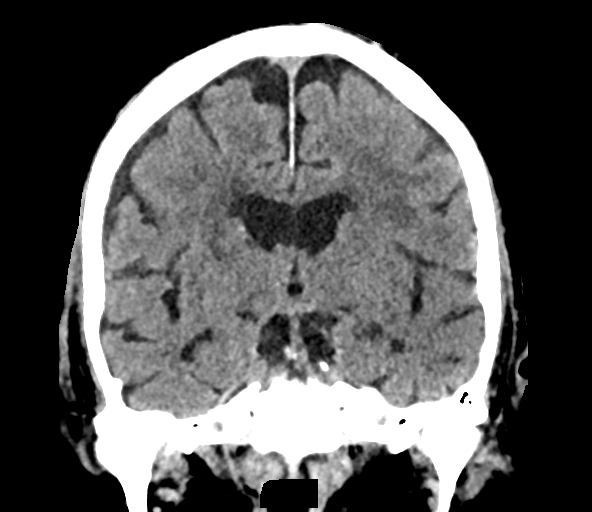

[Series 6: sag soft · sagittal · 0.34mm/px · 3 of 65 slices shown]
[im 25/65  brain]
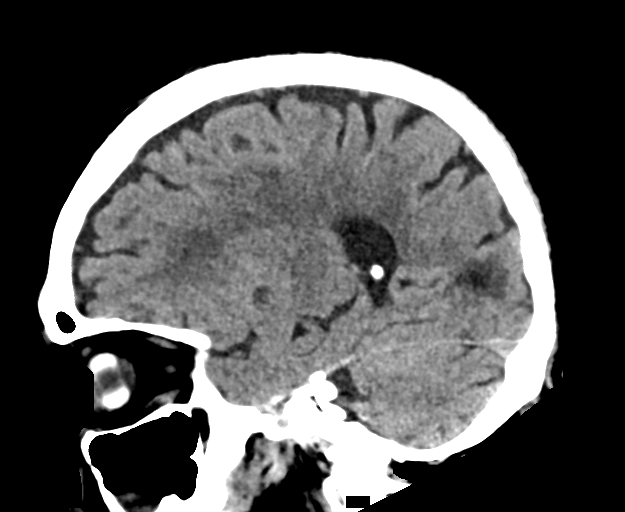
[im 33/65  brain]
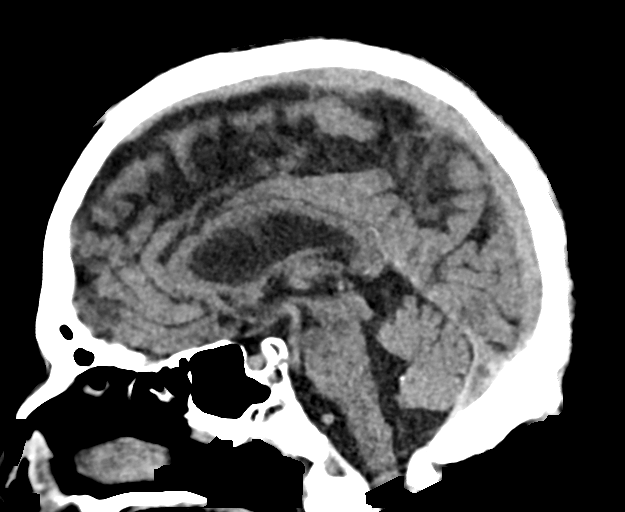
[im 41/65  brain]
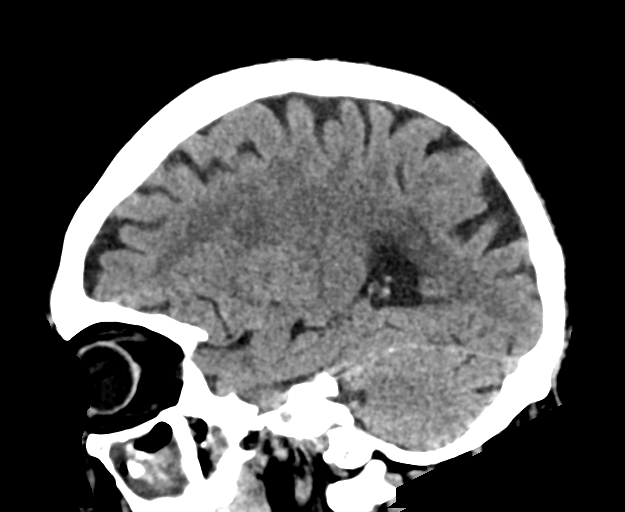

[15 of 47 positions shown; findings below may reference images not displayed]

FINDINGS: Brain:

No evidence of acute intracranial hemorrhage.

No acute demarcated cortical infarct is identified.

Chronic appearing right temporal occipital lobe cortically based
infarct. Ill-defined hypoattenuation within the cerebral white
matter is nonspecific, but consistent with chronic small vessel
ischemic disease. Age-indeterminate lacunar infarcts within the
right basal ganglia and left thalamus. Mild generalized parenchymal
atrophy.

No evidence of intracranial mass. No midline shift. Probable thin
right frontoparietal hygroma or chronic subdural hematoma measuring
up to 5 mm (series 5, image 28).

Vascular: A dense M1 left MCA is questioned (series 3, image 17).
Atherosclerotic calcifications.

Skull: Normal. Negative for fracture or focal lesion.

Sinuses/Orbits: Visualized orbits demonstrate no acute abnormality.
Partial opacification of the left maxillary sinus. Mucous retention
cyst within the inferior right maxillary sinus. No significant
mastoid effusion.

These results were called by telephone at the time of interpretation
on 12/12/2019 at [DATE] to provider Dr. Haente, who verbally
acknowledged these results.
IMPRESSION: No acute intracranial hemorrhage or acute demarcated cortical
infarction identified.

Suspicion for hyperdense M1 left MCA. ASPECTS 10 for left MCA
vascular territory.

Age-indeterminate lacunar infarcts within the right basal ganglia
and left thalamus.

Probable thin right frontoparietal hygroma or chronic subdural
hematoma measuring up to 5 mm.

Chronic appearing cortically based infarct within the right temporal
occipital lobes.

Background generalized parenchymal atrophy and chronic small vessel
ischemic disease.

## 2020-06-11 IMAGING — CT CT CERVICAL SPINE W/O CM
4 of 9 series · 12 of 33 positions shown, 13 images · IV contrast (APPLIED)
Comparison: Same day noncontrast CT head as well as CT angiogram
head/neck and CT perfusion head.

CLINICAL DATA: Code stroke.  Patient found down.

EXAM:
CT CERVICAL SPINE WITHOUT CONTRAST
TECHNIQUE: Multidetector CT imaging of the cervical spine was performed without
intravenous contrast. Multiplanar CT image reconstructions were also
generated.

[Series 14: cspine sag · sagittal · 0.33mm/px · 5 of 40 slices shown]
[im 7/40  bone]
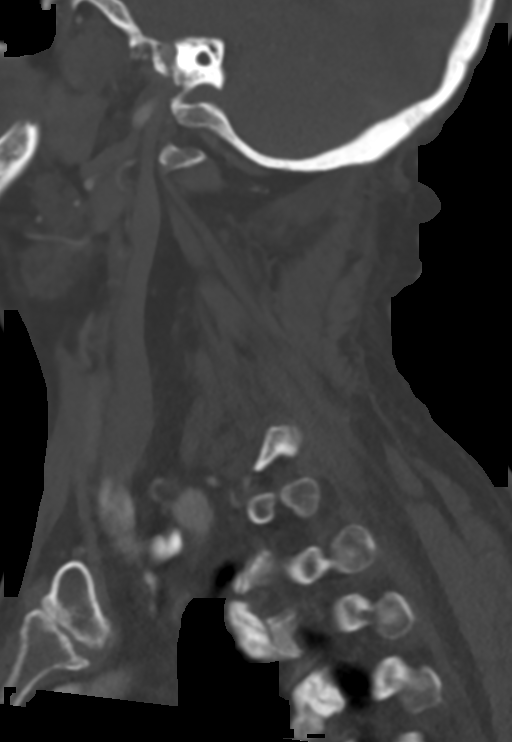
[im 14/40  bone]
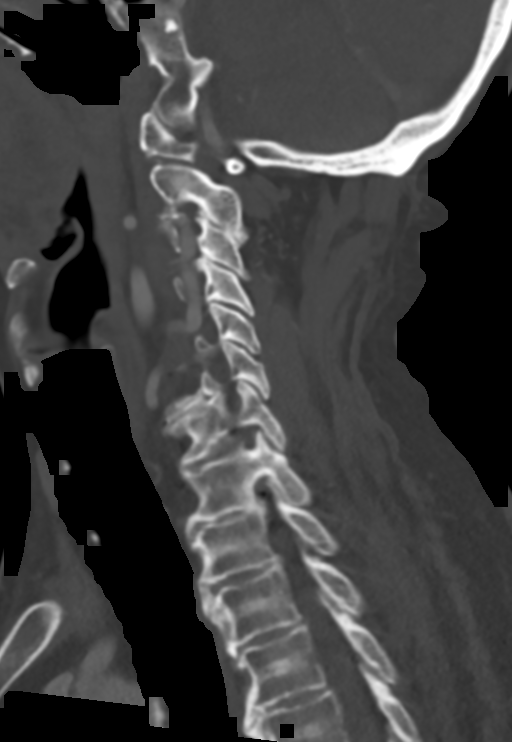
[im 20/40  bone]
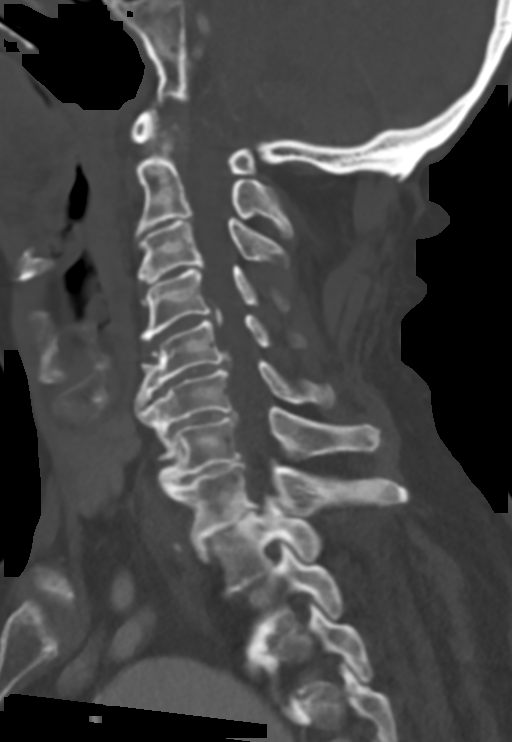
[im 27/40  bone]
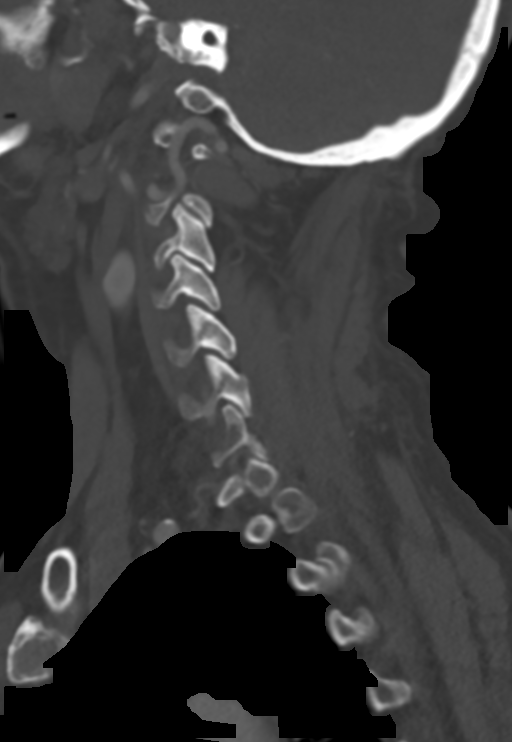
[im 33/40  bone]
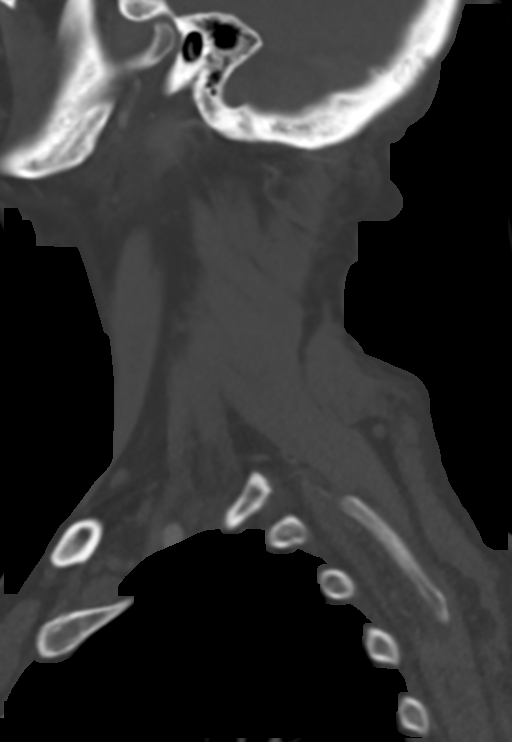

[Series 15: cspine cor thick · coronal · 0.36mm/px · 2 of 43 slices shown]
[im 5/43  bone]
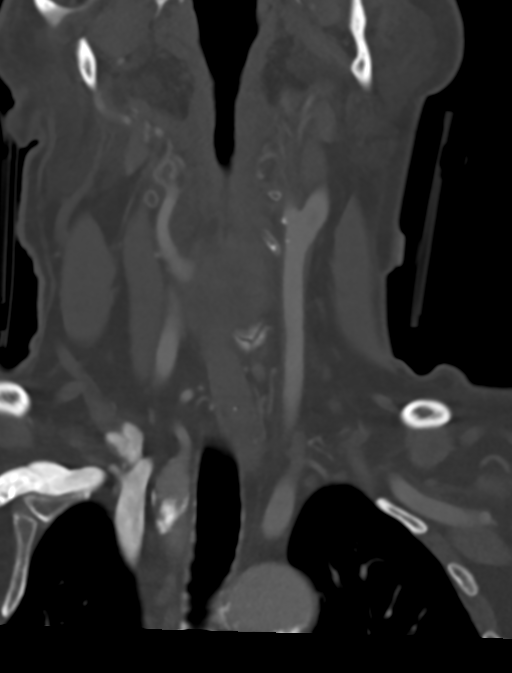
[im 24/43  bone]
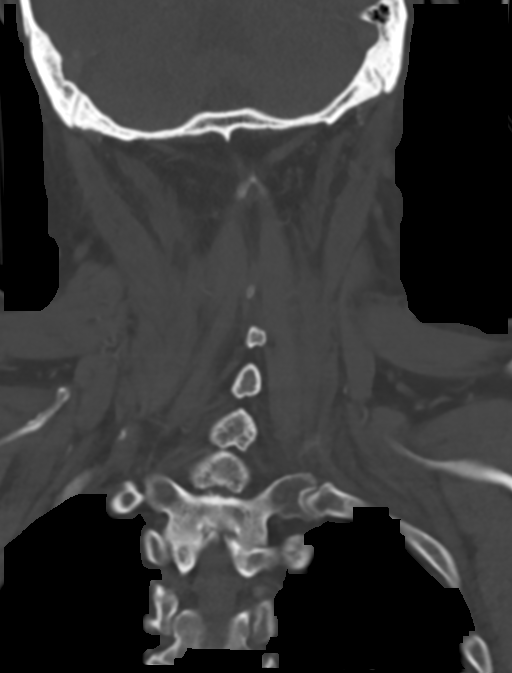

[Series 16: cspine ax st · axial · 0.26mm/px · z∈[-334,-107]mm · 3 of 78 slices shown, 4 images]
[im 1/78  soft-tissue]
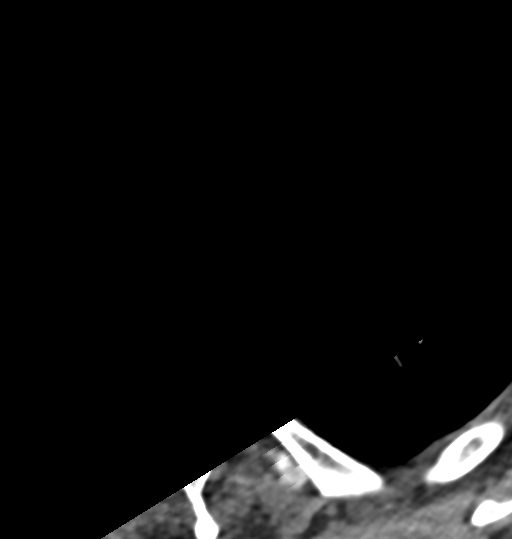
[im 1/78  bone]
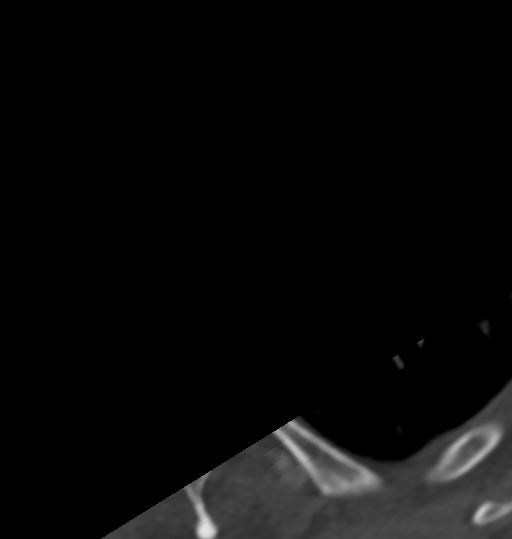
[im 39/78  bone]
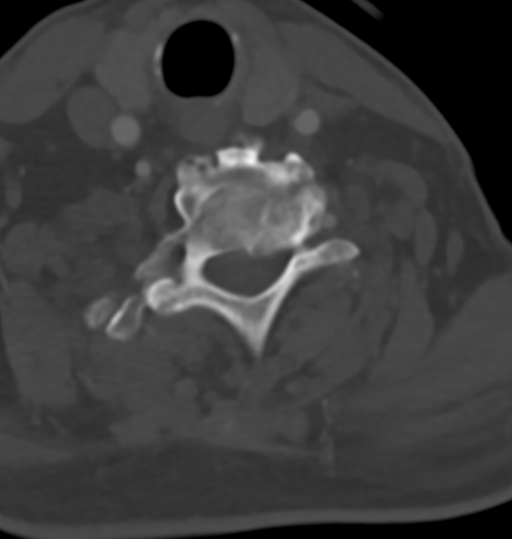
[im 78/78  bone]
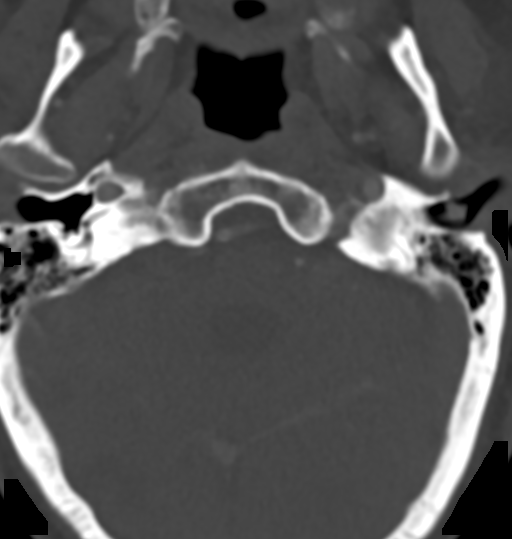

[Series 21: cspine · axial · 0.21mm/px · z∈[-253,-190]mm · 2 of 93 slices shown]
[im 31/93  bone]
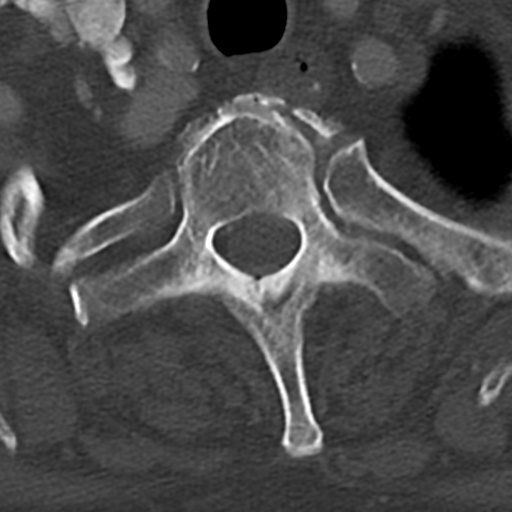
[im 62/93  bone]
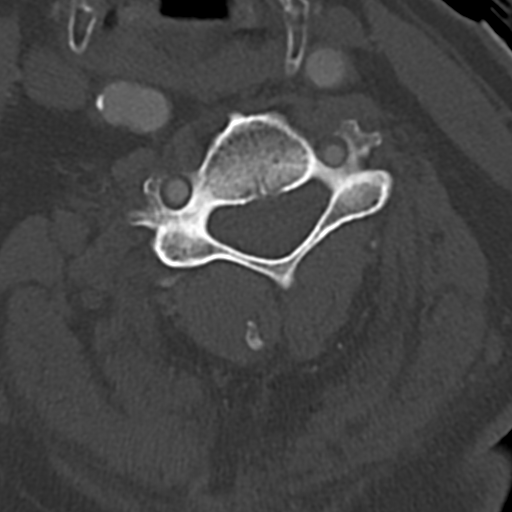

[12 of 33 positions shown; findings below may reference images not displayed]

FINDINGS: Alignment: Cervical levocurvature with incompletely imaged thoracic
dextrocurvature. No significant spondylolisthesis. Leftward rotation
of C1 upon C[DATE] be positional at the time of examination.

Skull base and vertebrae: The basion-dental and atlanto-dental
intervals are maintained.No evidence of acute fracture to the
cervical spine.

Soft tissues and spinal canal: No prevertebral fluid or swelling. No
visible canal hematoma.

Disc levels: Cervical spondylosis with multilevel disc height loss,
posterior disc osteophytes, uncovertebral and facet hypertrophy.
There also prominent multilevel ventral osteophytes within the
cervical and visualized upper thoracic spine.

Upper chest: No consolidation within the imaged lung apices. No
visible pneumothorax. Mild interstitial prominence within the medial
left lung apex is nonspecific.
IMPRESSION: 1. No evidence of acute fracture to the cervical spine.
2. Leftward rotation of C1 upon C[DATE] be positional at the time of
examination. Clinical correlation is recommended.
3. Cervical spondylosis as described.
4. Nonspecific interstitial prominence within the partially imaged
medial left lung apex. Chest radiographs are recommended for further
evaluation.

## 2021-05-09 DEATH — deceased
